# Patient Record
Sex: Male | Born: 1975 | Race: Black or African American | Hispanic: No | Marital: Married | Smoking: Never smoker
Health system: Southern US, Community
[De-identification: ages and names within clinical notes are randomized; demographics above are authoritative.]

## PROBLEM LIST (undated history)

## (undated) ENCOUNTER — Emergency Department: Source: Home / Self Care

## (undated) DIAGNOSIS — F909 Attention-deficit hyperactivity disorder, unspecified type: Secondary | ICD-10-CM

## (undated) DIAGNOSIS — L409 Psoriasis, unspecified: Secondary | ICD-10-CM

## (undated) HISTORY — PX: TESTICLE TORSION REDUCTION: SHX795

## (undated) HISTORY — PX: OTHER SURGICAL HISTORY: SHX169

## (undated) HISTORY — DX: Attention-deficit hyperactivity disorder, unspecified type: F90.9

## (undated) HISTORY — DX: Psoriasis, unspecified: L40.9

---

## 1998-08-25 ENCOUNTER — Encounter: Payer: Self-pay | Admitting: *Deleted

## 1998-08-25 ENCOUNTER — Emergency Department (HOSPITAL_COMMUNITY): Admission: EM | Admit: 1998-08-25 | Discharge: 1998-08-25 | Payer: Self-pay | Admitting: *Deleted

## 2007-07-27 ENCOUNTER — Ambulatory Visit: Payer: Self-pay | Admitting: Internal Medicine

## 2007-07-28 LAB — CONVERTED CEMR LAB
Cholesterol: 174 mg/dL (ref 0–200)
Glucose, Bld: 79 mg/dL (ref 70–99)
HDL: 55.7 mg/dL (ref >39.0)
LDL Cholesterol: 110 mg/dL — ABNORMAL HIGH (ref 0–99)
Total CHOL/HDL Ratio: 3.1
Triglycerides: 43 mg/dL (ref 0–149)
VLDL: 9 mg/dL (ref 0–40)

## 2007-11-14 ENCOUNTER — Ambulatory Visit: Payer: Self-pay | Admitting: Internal Medicine

## 2007-11-14 DIAGNOSIS — F988 Other specified behavioral and emotional disorders with onset usually occurring in childhood and adolescence: Secondary | ICD-10-CM

## 2007-11-14 DIAGNOSIS — F900 Attention-deficit hyperactivity disorder, predominantly inattentive type: Secondary | ICD-10-CM | POA: Insufficient documentation

## 2007-12-19 ENCOUNTER — Ambulatory Visit: Payer: Self-pay | Admitting: Internal Medicine

## 2008-01-19 ENCOUNTER — Ambulatory Visit: Payer: Self-pay | Admitting: Internal Medicine

## 2008-02-20 ENCOUNTER — Telehealth (INDEPENDENT_AMBULATORY_CARE_PROVIDER_SITE_OTHER): Payer: Self-pay | Admitting: *Deleted

## 2020-05-27 ENCOUNTER — Encounter: Payer: Self-pay | Admitting: Internal Medicine

## 2020-05-27 ENCOUNTER — Other Ambulatory Visit: Payer: Self-pay

## 2020-05-27 ENCOUNTER — Ambulatory Visit (INDEPENDENT_AMBULATORY_CARE_PROVIDER_SITE_OTHER): Payer: Self-pay | Admitting: Internal Medicine

## 2020-05-27 ENCOUNTER — Ambulatory Visit (INDEPENDENT_AMBULATORY_CARE_PROVIDER_SITE_OTHER)
Admission: RE | Admit: 2020-05-27 | Discharge: 2020-05-27 | Disposition: A | Discharge status: Home / Self Care | Payer: Self-pay | Source: Ambulatory Visit | Attending: Internal Medicine | Admitting: Internal Medicine

## 2020-05-27 VITALS — BP 106/80 | HR 81 | Temp 97.9°F | Ht 73.0 in | Wt 221.0 lb

## 2020-05-27 DIAGNOSIS — M79671 Pain in right foot: Secondary | ICD-10-CM | POA: Insufficient documentation

## 2020-05-27 DIAGNOSIS — L409 Psoriasis, unspecified: Secondary | ICD-10-CM | POA: Insufficient documentation

## 2020-05-27 NOTE — Assessment & Plan Note (Addendum)
With story, considered DVT or venous disease-----not applicable considering the area involved I am concerned about bony changes or possible stress fracture---will check x-ray X-ray negative except for some swelling  Discussed ice when it hurts Helped by NSAIDs---continue Will set up with Dr Patsy Lager

## 2020-05-27 NOTE — Progress Notes (Signed)
Subjective:    Patient ID: Jose Lee, male    DOB: 1976-10-12, 44 y.o.   MRN: [id number]  HPI Here due to right foot swelling Hasn't been seen in over 10 years---no other doctors  This visit occurred during the SARS-CoV-2 public health emergency.  Safety protocols were in place, including screening questions prior to the visit, additional usage of staff PPE, and extensive cleaning of exam room while observing appropriate contact time as indicated for disinfecting solutions.   No longer has issues with attention  Psoriasis for 5 years or so Doing well with cosentyx ---though doesn't like having to take the shots though  No trauma but noticed some pain in foot ~2 months ago Pain was more pronounced and caused limp Then swelling started--bad enough that toes weren't touching ground Swelling seems worse at night--- and can actually wake him up at night Tried compression socks--no clear improvement Swelling is intermittent  Current Outpatient Medications on File Prior to Visit  Medication Sig Dispense Refill  . Clobetasol Propionate Emulsion 0.05 % topical foam Apply topically 2 (two) times daily as needed.    . Secukinumab (COSENTYX [place]) Inject into the skin.     No current facility-administered medications on file prior to visit.    No Known Allergies  Past Medical History:  Diagnosis Date  . ADHD   . Psoriasis     Past Surgical History:  Procedure Laterality Date  . TESTICLE TORSION REDUCTION  age 40    Family History  Problem Relation Age of Onset  . Diabetes Mother   . Hypertension Father   . Heart disease Father   . Obesity Brother   . Cancer Neg Hx     Social History   Socioeconomic History  . Marital status: Married    Spouse name: Not on file  . Number of children: 2  . Years of education: Not on file  . Highest education level: Not on file  Occupational History  . Occupation: Radio producer    Comment: [employer]  Tobacco Use  . Smoking status:  Never Smoker  . Smokeless tobacco: Never Used  Substance and Sexual Activity  . Alcohol use: Yes    Comment: occasional  . Drug use: Not on file  . Sexual activity: Not on file  Other Topics Concern  . Not on file  Social History Narrative  . Not on file   Social Determinants of Health   Financial Resource Strain:   . Difficulty of Paying Living Expenses:   Food Insecurity:   . Worried About Programme researcher, broadcasting/film/video in the Last Year:   . Barista in the Last Year:   Transportation Needs:   . Freight forwarder (Medical):   Marland Kitchen Lack of Transportation (Non-Medical):   Physical Activity:   . Days of Exercise per Week:   . Minutes of Exercise per Session:   Stress:   . Feeling of Stress :   Social Connections:   . Frequency of Communication with Friends and Family:   . Frequency of Social Gatherings with Friends and Family:   . Attends Religious Services:   . Active Member of Clubs or Organizations:   . Attends Banker Meetings:   Marland Kitchen Marital Status:   Intimate Partner Violence:   . Fear of Current or Ex-Partner:   . Emotionally Abused:   Marland Kitchen Physically Abused:   . Sexually Abused:    Review of Systems No chest pain Appetite is good  No history of gout Did have left toe pain a couple of years ago-derm felt it was the psoriasis    Objective:   Physical Exam Constitutional:      Appearance: Normal appearance.  Musculoskeletal:     Right lower leg: No edema.     Left lower leg: No edema.     Comments: Mild swelling in right forefoot Tenderness ~ 2nd and 3rd metatarsals distally No inflammation No calf tenderness or swelling  Neurological:     Mental Status: He is alert.            Assessment & Plan:

## 2020-06-17 ENCOUNTER — Encounter: Payer: Self-pay | Admitting: Family Medicine

## 2020-06-17 ENCOUNTER — Other Ambulatory Visit: Payer: Self-pay

## 2020-06-17 ENCOUNTER — Ambulatory Visit (INDEPENDENT_AMBULATORY_CARE_PROVIDER_SITE_OTHER): Payer: BC Managed Care – PPO | Admitting: Family Medicine

## 2020-06-17 VITALS — BP 112/80 | HR 68 | Temp 98.4°F | Ht 73.0 in | Wt 223.0 lb

## 2020-06-17 DIAGNOSIS — M79671 Pain in right foot: Secondary | ICD-10-CM

## 2020-06-17 DIAGNOSIS — M8430XA Stress fracture, unspecified site, initial encounter for fracture: Secondary | ICD-10-CM | POA: Diagnosis not present

## 2020-06-17 NOTE — Patient Instructions (Signed)
"  closed toe cam walker"

## 2020-06-17 NOTE — Progress Notes (Signed)
Jose Plant T. Ikechukwu Cerny, MD, CAQ Sports Medicine  Primary Care and Sports Medicine [facility] at [facility] [address] [place], [address]  Phone: [phone]  FAX: [phone]  Jose Lee - 44 y.o. male  MRN [id number]  Date of Birth: [birth year]  Date: 06/17/2020  PCP: Karie Schwalbe, MD  Referral: Karie Schwalbe, MD  Chief Complaint  Patient presents with  . Foot Pain    Right    This visit occurred during the SARS-CoV-2 public health emergency.  Safety protocols were in place, including screening questions prior to the visit, additional usage of staff PPE, and extensive cleaning of exam room while observing appropriate contact time as indicated for disinfecting solutions.   Subjective:   Jose Lee is a 44 y.o. very pleasant male patient with Body mass index is 29.16 kg/m. who presents with the following:  New consultation on a patient of Dr. Karle Starch with R sided foot pain.  I reviewed his prior films which did not show a fracture.  This is an independent review by myself.  We reviewed these face-to-face in the office.  He has been having some pain for approximately 4 to 5 months relatively distal but not in the MTP joints.  He is not any specific trauma or injury.  He has increased his walking to approximately 15,000 steps a day.  He has not had any increased risk in terms of started a jogging or other new different exercise program.  He is globally healthy and is never had an insufficiency fracture or fragility fracture.  He does have some dorsal swelling, but he is never had any bruising.   Review of Systems is noted in the HPI, as appropriate   Objective:   Ht 6\' 1"  (1.854 m)   BMI 29.16 kg/m    GEN: No acute distress; alert,appropriate. PULM: Breathing comfortably in no respiratory distress PSYCH: Normally interactive.    Right foot and ankle are nontender from the tibia to fibula.  Kleiger test is negative.   Full range of motion at the knee.  Full range of motion at the ankle with intact strength.  Nontender at the medial lateral malleolus.  Nontender at the talus.  Nontender at the cuneiforms, cuboid, navicular.  The entirety of the midfoot is nontender.  The fifth metatarsal as well as the first metatarsal are nontender.  Nontender at the first MTP as well as minimally to nontender throughout the remainder of the MTP joints.  On the distal shaft proximal to the MTP joint the patient does have tenderness to palpation in shafts 2 through 4.  Radiology: DG Foot Complete Right  Result Date: 05/27/2020 CLINICAL DATA:  Right forefoot pain and swelling. EXAM: RIGHT FOOT COMPLETE - 3+ VIEW COMPARISON:  None. FINDINGS: There is no evidence of fracture or dislocation. There is no evidence of arthropathy or other focal bone abnormality. Minor soft tissue edema over the dorsum of the foot. IMPRESSION: Minor soft tissue edema over the dorsum of the foot. No osseous abnormality. Electronically Signed   By: 05/29/2020 M.D.   On: 05/27/2020 15:57    Assessment and Plan:     ICD-10-CM   1. Right foot pain  M79.671   2. Stress reaction of bone  M84.30XA    Total encounter time: 30 minutes. This includes total time spent on the day of encounter.  Chart review, x-ray review, and anatomy reviewed with the patient using an anatomical model.  Patient  does wear steel toe shoes, and this presents somewhat limiting factor.  Ideally I would have placed him in a postoperative shoe, but for work he has to have a closed toed shoe.  The cam walker as we have in the office would not work here.  We did review some options over the Internet, and I showed him a close toed fracture boot.  He is going to buy this himself and I will reassess him in 1 month.  Follow-up: Return in about 1 month (around 07/18/2020).  No orders of the defined types were placed in this encounter.  There are no discontinued medications. No orders  of the defined types were placed in this encounter.   Signed,  Elpidio Galea. Kolbie Lepkowski, MD   Outpatient Encounter Medications as of 06/17/2020  Medication Sig  . Clobetasol Propionate Emulsion 0.05 % topical foam Apply topically 2 (two) times daily as needed.  . Secukinumab (COSENTYX [place]) Inject into the skin.   No facility-administered encounter medications on file as of 06/17/2020.

## 2020-07-18 ENCOUNTER — Ambulatory Visit: Payer: BC Managed Care – PPO | Admitting: Internal Medicine

## 2020-07-22 ENCOUNTER — Ambulatory Visit (INDEPENDENT_AMBULATORY_CARE_PROVIDER_SITE_OTHER): Payer: BC Managed Care – PPO | Admitting: Family Medicine

## 2020-07-22 ENCOUNTER — Encounter: Payer: Self-pay | Admitting: Family Medicine

## 2020-07-22 ENCOUNTER — Other Ambulatory Visit: Payer: Self-pay

## 2020-07-22 VITALS — BP 100/60 | HR 69 | Temp 98.7°F | Ht 73.0 in | Wt 221.5 lb

## 2020-07-22 DIAGNOSIS — M79671 Pain in right foot: Secondary | ICD-10-CM | POA: Diagnosis not present

## 2020-07-22 DIAGNOSIS — M8430XA Stress fracture, unspecified site, initial encounter for fracture: Secondary | ICD-10-CM | POA: Diagnosis not present

## 2020-07-22 NOTE — Patient Instructions (Addendum)
Take Tylenol/Acetaminophen ES (500mg ) 2 tabs by mouth three times a day max as needed.  Alleve 2 tabs by mouth two times a day over the counter: Take at least for 2 - 3 weeks. This is equal to a prescripton strength dose (GENERIC CHEAPER EQUIVALENT IS NAPROXEN SODIUM)    REFERRALS TO SPECIALISTS, SPECIAL TESTS (MRI, CT, ULTRASOUNDS)  [place] will help you.   During the  Covid-19 outbreak we are not having people meet with the patient care coordinators for their protection.  They will call you when your appointment is made.   In the most extreme case (like a stroke), I will try to get them to talk to you in the office, but some insurances require paperwork and authorizations.  This will always take some time.

## 2020-07-22 NOTE — Progress Notes (Signed)
Britt Petroni T. Carylon Tamburro, MD, CAQ Sports Medicine  Primary Care and Sports Medicine [facility] at [facility] [address] [place], [address]  Phone: [phone]   FAX: [phone]  Jose Lee - 44 y.o. male   MRN [id number]   Date of Birth: May 14, 1976  Date: 07/22/2020   PCP: Karie Schwalbe, MD   Referral: Karie Schwalbe, MD  Chief Complaint  Patient presents with   Follow-up    Right Foot    This visit occurred during the SARS-CoV-2 public health emergency.  Safety protocols were in place, including screening questions prior to the visit, additional usage of staff PPE, and extensive cleaning of exam room while observing appropriate contact time as indicated for disinfecting solutions.   Subjective:   Jose Lee is a 44 y.o. very pleasant male patient with Body mass index is 29.22 kg/m. who presents with the following:  I placed him in a closed-toe fracture boot due to clinically a R sided 2-4 stress reaction.  He has not been able to wear his cam walker boot in the workday, and he is really not been wearing it.  He continues to walk somewhere between 12 and 15,000 steps a day.  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  15,000 steps day increased  06/17/2020 Last OV with Hannah Beat, MD  New consultation on a patient of Dr. Karle Starch with R sided foot pain.  I reviewed his prior films which did not show a fracture.  This is an independent review by myself.  We reviewed these face-to-face in the office.  He has been having some pain for approximately 4 to 5 months relatively distal but not in the MTP joints.  He is not any specific trauma or injury.  He has increased his walking to approximately 15,000 steps a day.  He has not had any increased risk in terms of started a jogging or other new different exercise program.  He is globally healthy and is  never had an insufficiency fracture or fragility fracture.  He does have some dorsal swelling, but he is never had any bruising.  Review of Systems is noted in the HPI, as appropriate   Objective:   BP 100/60    Pulse 69    Temp 98.7 F (37.1 C) (Temporal)    Ht 6\' 1"  (1.854 m)    Wt 221 lb 8 oz (100.5 kg)    SpO2 97%    BMI 29.22 kg/m    GEN: No acute distress; alert,appropriate. PULM: Breathing comfortably in no respiratory distress PSYCH: Normally interactive.    Right side: The entirety of the tibia, fibula, ankle, hindfoot as well as the midfoot are nontender.  He does have tenderness predominantly at the second and third metatarsal shafts.  In the soft tissues there is less pain.  Nontender at shafts 4 and 5 as well as 1.  Radiology: DG Foot Complete Right  Result Date: 05/27/2020 CLINICAL DATA:  Right forefoot pain and swelling. EXAM: RIGHT FOOT COMPLETE - 3+ VIEW COMPARISON:  None. FINDINGS: There is no evidence of fracture or dislocation. There is no evidence of arthropathy or other focal bone abnormality. Minor soft tissue edema over the dorsum of the foot. IMPRESSION: Minor soft tissue edema over the dorsum of the foot. No osseous abnormality. Electronically Signed   By: 05/29/2020 M.D.   On: 05/27/2020  15:57   Assessment and Plan:     ICD-10-CM   1. Right foot pain  M79.671 MR FOOT RIGHT WO CONTRAST  2. Stress reaction of bone  M84.30XA MR FOOT RIGHT WO CONTRAST   Clinical concern for metatarsal stress reaction versus stress fracture.  Definitive diagnosis is needed for appropriate treatment.  Assessment for occult fracture needs to occur as well.  Obtain an MRI of the right foot to evaluate for occult fracture, stress fracture of the metatarsals.  Further treatment plan depends entirely on the patient's MRI of his foot.  There are no discontinued medications. Orders Placed This Encounter  Procedures   MR FOOT RIGHT WO CONTRAST    Signed,  Nettie Cromwell T.  Graysen Depaula, MD   Outpatient Encounter Medications as of 07/22/2020  Medication Sig   Clobetasol Propionate Emulsion 0.05 % topical foam Apply topically 2 (two) times daily as needed.   Secukinumab (COSENTYX [place]) Inject into the skin.   No facility-administered encounter medications on file as of 07/22/2020.

## 2020-08-07 ENCOUNTER — Ambulatory Visit
Admission: RE | Admit: 2020-08-07 | Discharge: 2020-08-07 | Disposition: A | Discharge status: Home / Self Care | Payer: BC Managed Care – PPO | Source: Ambulatory Visit | Attending: Family Medicine | Admitting: Family Medicine

## 2020-08-07 ENCOUNTER — Other Ambulatory Visit: Payer: Self-pay

## 2020-08-07 DIAGNOSIS — M79671 Pain in right foot: Secondary | ICD-10-CM

## 2020-08-07 DIAGNOSIS — M8430XA Stress fracture, unspecified site, initial encounter for fracture: Secondary | ICD-10-CM

## 2020-08-12 ENCOUNTER — Other Ambulatory Visit: Payer: BC Managed Care – PPO

## 2020-08-22 ENCOUNTER — Telehealth: Payer: Self-pay | Admitting: Internal Medicine

## 2020-08-22 NOTE — Telephone Encounter (Signed)
Patient came into office and dropped off disability paperwork. Patient stated it is due November 1st. Placed in tower.

## 2020-08-23 NOTE — Telephone Encounter (Signed)
disability paperwork in dr copland's in box  On mri results you wanted pt to be out of work 4 weeks.  Pt would like 6 weeks Need end date on question 1 and return to work on question 2

## 2020-08-23 NOTE — Telephone Encounter (Signed)
I don't have this Are you working on it?

## 2020-08-25 NOTE — Telephone Encounter (Signed)
I cannot clear him to return to work without checking him in the office  Please make an appointment

## 2020-08-26 NOTE — Telephone Encounter (Signed)
Pt will be going out of work on 09/02/20   He scheduled follow up with you 09/23/20 to discuss when he can return to work

## 2020-08-27 NOTE — Telephone Encounter (Signed)
Pt is requesting a callback to understand why he needs to be seen sooner than the appointment he made 2 days.

## 2020-08-27 NOTE — Telephone Encounter (Signed)
Can you call him directly:  I have told him to immobilize his foot starting 06/17/2020 a couple of times, and then again 08/18/2020 when his MRI returned.  He needed to do this well before now and well before 09/02/2020, and I need to document everything very clearly for his FMLA.  It is not appropriate for him not to come into the office until 09/23/2020, and he needs to be seen now.

## 2020-08-27 NOTE — Telephone Encounter (Signed)
Dr. Patsy Lager please call patient. .  We could not get Mr. Jose Lee to understand that he needs an appointment to follow up, no matter how we tried to explain it to him. He wants a call back to understand why he needs an appointment.  I don't know how clearer to make it.

## 2020-08-27 NOTE — Telephone Encounter (Signed)
Tried to call Jose Lee. Mailbox is full and can not accept any messages at this time.

## 2020-08-28 NOTE — Telephone Encounter (Signed)
Jose Lee,   Can you set him up with a 4 week follow-up after 09/02/2020, approximately 09/30/2020 or slightly after Thanksgiving if needed.

## 2020-08-28 NOTE — Telephone Encounter (Signed)
I had an approximate 30-minute conversation with the patient right now.  I recommended that he come into the office to discuss multiple issues and reexamine his foot.  I have not seen the patient in over 5 weeks.  I was going to review his MRI with him face-to-face and review all films and show him the area of concern.  I reviewed with him that this is fairly standard.  He has been having symptoms for 6 months, and unfortunately he has not been able to immobilize his foot secondary to working concerns and requirement of walking approximately 15,000 steps a day.  He also requires steel toed boots.  We talked in the office previously, and when his MRI returned, I recommended immobilization and if not possible he would need to be out of work to ensure healing.  I read aloud my exact wording.  Without immobilization and without decreasing his steps, then I am doubtful that his stress reaction or fracture would improve and it very likely would worsen.  There is a clinical concern for fracture worsening, and in a worse case scenario developing a true fracture with displacement.  I also wanted to review with him face-to-face his disability paperwork and answer any and all of his questions.  He had some question regarding my thought process on face-to-face encounter and clinical reassessment.  I told the patient initially in August and subsequently in September that my clinical diagnosis was stress reaction or stress fracture of the second and third metatarsals and I recommended immobilization and decreased walking. MRI 10/7 confirmed my 06/2020 and 07/2020 diagnosis.  The patient had some questions regarding clinical judgment in the field of medicine in general compared to diagnosis with advanced imaging or other testing.  I did my best to answer these questions, and in the vast majority of cases with metatarsal stress fractures and stress reactions these injuries do well with clinical assessment and management  alone.  Most heal in 4 weeks.  My plan is to initiate out of work status for 09/02/2020 for his short-term disability with reassessment 4 weeks later.   Electronically Signed  By: Hannah Beat, MD On: 08/28/2020 5:41 PM   Cc: Mrs. Syliva Overman, RN

## 2020-08-29 NOTE — Telephone Encounter (Signed)
11/29 appointment Pt aware

## 2020-09-02 DIAGNOSIS — Z0279 Encounter for issue of other medical certificate: Secondary | ICD-10-CM

## 2020-09-03 NOTE — Telephone Encounter (Signed)
Paperwork faxed Pt aware  

## 2020-09-04 NOTE — Telephone Encounter (Signed)
Copy for pt °Copy for scan °Copy for billing °

## 2020-09-23 ENCOUNTER — Ambulatory Visit: Payer: BC Managed Care – PPO | Admitting: Family Medicine

## 2020-09-30 ENCOUNTER — Other Ambulatory Visit: Payer: Self-pay

## 2020-09-30 ENCOUNTER — Ambulatory Visit (INDEPENDENT_AMBULATORY_CARE_PROVIDER_SITE_OTHER): Payer: BC Managed Care – PPO | Admitting: Family Medicine

## 2020-09-30 ENCOUNTER — Ambulatory Visit (INDEPENDENT_AMBULATORY_CARE_PROVIDER_SITE_OTHER)
Admission: RE | Admit: 2020-09-30 | Discharge: 2020-09-30 | Disposition: A | Discharge status: Home / Self Care | Payer: BC Managed Care – PPO | Source: Ambulatory Visit | Attending: Family Medicine | Admitting: Family Medicine

## 2020-09-30 VITALS — BP 110/78 | HR 84 | Temp 98.0°F | Ht 73.0 in | Wt 225.0 lb

## 2020-09-30 DIAGNOSIS — M2550 Pain in unspecified joint: Secondary | ICD-10-CM

## 2020-09-30 DIAGNOSIS — M79671 Pain in right foot: Secondary | ICD-10-CM | POA: Diagnosis not present

## 2020-09-30 DIAGNOSIS — M8430XA Stress fracture, unspecified site, initial encounter for fracture: Secondary | ICD-10-CM

## 2020-09-30 DIAGNOSIS — L409 Psoriasis, unspecified: Secondary | ICD-10-CM | POA: Diagnosis not present

## 2020-09-30 MED ORDER — PREDNISONE 20 MG PO TABS: ORAL_TABLET | ORAL | 0 refills | Status: DC

## 2020-09-30 NOTE — Progress Notes (Signed)
Jose Lee T. Jose Hawkey, MD, CAQ Sports Medicine  Primary Care and Sports Medicine [facility] [address] [place], [address]  Phone: [phone]  FAX: [phone]  Jose Lee - 44 y.o. male  MRN [id number]  Date of Birth: 1975-12-19  Date: 09/30/2020  PCP: Karie Schwalbe, MD  Referral: Karie Schwalbe, MD  Chief Complaint  Patient presents with  . Follow-up    R foot    This visit occurred during the SARS-CoV-2 public health emergency.  Safety protocols were in place, including screening questions prior to the visit, additional usage of staff PPE, and extensive cleaning of exam room while observing appropriate contact time as indicated for disinfecting solutions.   Subjective:   Jose Lee is a 44 y.o. very pleasant male patient with Body mass index is 29.69 kg/m. who presents with the following:  F/u R stress reaction of MT shaft at 2nd and 3rd MT shafts, MRI confirmed.  He continues to do poorly.  He has been having symptoms for approximately 6 months.  He does walk somewhere around 10-15,000 steps a day, and he was never able to limit this at work.  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  He limited his walking to approximately 1000-1500 steps daily.  His swelling has decreased somewhat, but he still does have some quite significant pain in the distal forefoot.  He also has some pain in the MTP joints to a much lesser extent.  He is very frustrated at his lack of progress, and his inability to work.  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  Patient Active Problem List   Diagnosis Date Noted  . Right foot pain 05/27/2020  . Psoriasis   . ATTENTION DEFICIT DISORDER 11/14/2007    Past Medical History:  Diagnosis Date  . ADHD   . Psoriasis     Past Surgical History:  Procedure Laterality Date  .  TESTICLE TORSION REDUCTION  age 57    Family History  Problem Relation Age of Onset  . Diabetes Mother   . Hypertension Father   . Heart disease Father   . Obesity Brother   . Cancer Neg Hx      Review of Systems is noted in the HPI, as appropriate   Objective:   BP 110/78   Pulse 84   Temp 98 F (36.7 C) (Temporal)   Ht 6\' 1"  (1.854 m)   Wt 225 lb (102.1 kg)   SpO2 97%   BMI 29.69 kg/m   Right foot: He is nontender throughout the entirety of the tibia and fibula.  Nontender at the calcaneus.  Nontender at the talus.  Nontender at the navicular, cuboid, cuneiforms.  Nontender with drawer testing and subtalar tilt testing.  He is not tender throughout the phalanges.  He does have tenderness with movement and deep palpation at the MTP joints 1 through 3.  2 and 3 are significantly worse compared to 1.  Along the metatarsal shafts there appears to be pain to palpation particularly at 2 and 3 distally and to a lesser extent on 4.  With squeezing the metatarsal heads, he also has some significant pain.  Radiology: MR FOOT RIGHT WO CONTRAST  Result Date: 08/08/2020 CLINICAL DATA:  Forefoot pain primarily between the second and third metatarsal for 6 months. Stress fracture  suspected. EXAM: MRI OF THE RIGHT FOREFOOT WITHOUT CONTRAST TECHNIQUE: Multiplanar, multisequence MR imaging of the right forefoot was performed. No intravenous contrast was administered. COMPARISON:  X-ray 05/27/2020 FINDINGS: Bones/Joint/Cartilage Bone marrow edema within the second and third metatarsal head and neck. Periosteal edema surrounds the second and third metatarsal diaphyses. There is no well-defined linear low signal fracture line. No bony erosion. Mild degenerative changes of the medial hallux-sesamoid complex with subchondral marrow signal changes. The remaining osseous structures are intact. No dislocation. There are small joint effusions involving the second and third MTP joints. Ligaments Intact  Lisfranc ligament. Collateral ligaments of the forefoot are intact. There is pericapsular edema associated with the second and third MTP joints. No evidence of plantar plate disruption. Muscles and Tendons Intramuscular edema within the intrinsic foot musculature adjacent to the second and third metatarsals. No muscle atrophy or fatty infiltration. No intramuscular fluid collection. Intact flexor and extensor tendons. No tenosynovial fluid. Soft tissues Mild dorsal subcutaneous edema.  No soft tissue fluid collection. IMPRESSION: 1. Findings most suggestive of stress-related changes involving the second and third metatarsals. No well-defined fracture line. 2. Small joint effusions involving the second and third MTP joints, likely reactive. 3. Mild degenerative changes of the medial hallux-sesamoid complex. Electronically Signed   By: Duanne Guess D.O.   On: 08/08/2020 08:58   Assessment and Plan:     ICD-10-CM   1. Right foot pain  M79.671 DG Foot Complete Right    Ambulatory referral to Orthopedic Surgery    Sedimentation rate    High sensitivity CRP    ANA Screen,IFA,Reflex Titer/Pattern,Reflex Mplx 11 Ab Cascade with IdentRA    Cyclic citrul peptide antibody, IgG    Rheumatoid factor  2. Stress reaction of bone  M84.30XA DG Foot Complete Right    Ambulatory referral to Orthopedic Surgery    Sedimentation rate    High sensitivity CRP    ANA Screen,IFA,Reflex Titer/Pattern,Reflex Mplx 11 Ab Cascade with IdentRA    Cyclic citrul peptide antibody, IgG    Rheumatoid factor  3. Psoriasis  L40.9 Sedimentation rate    High sensitivity CRP    ANA Screen,IFA,Reflex Titer/Pattern,Reflex Mplx 11 Ab Cascade with IdentRA    Cyclic citrul peptide antibody, IgG    Rheumatoid factor  4. Polyarthralgia  M25.50 Sedimentation rate    High sensitivity CRP    ANA Screen,IFA,Reflex Titer/Pattern,Reflex Mplx 11 Ab Cascade with IdentRA    Cyclic citrul peptide antibody, IgG    Rheumatoid factor   I am at  a loss for why he continues to do so poorly.  He does have psoriasis, so I am going to check him with basic rheumatological panel.  Psoriatic arthritis or rheumatoid arthritis could potentially be a explanation and complicating factor.  Typically metatarsal stress fractures heal easily in a healthy 44 year old.  He has been compliant, and he is minimally been ambulating and he has been in either a stiff shoe or cam walker.  I am going to do a short course of some steroids to see if this provides almost immediate relief in the MTP joints.  If this is the case then rheumatological involvement would be certainly possible.  We did talk about the potential risks and benefits.  With stress reaction only, I think that the potential benefit outweighs potential risk.  We did go over potential risk and that with true fracture, steroids can ultimately delay healing.  In this case, I would like to consult foot and ankle surgery for their opinion.  Assistance is appreciated.  Meds ordered this encounter  Medications  . predniSONE (DELTASONE) 20 MG tablet    Sig: 2 tabs po for 3 days, then 1 tab po for 4 days    Dispense:  10 tablet    Refill:  0   Medications Discontinued During This Encounter  Medication Reason  . Secukinumab (COSENTYX [place]) Change in therapy   Orders Placed This Encounter  Procedures  . DG Foot Complete Right  . Sedimentation rate  . High sensitivity CRP  . ANA Screen,IFA,Reflex Titer/Pattern,Reflex Mplx 11 Ab Cascade with IdentRA  . Cyclic citrul peptide antibody, IgG  . Rheumatoid factor  . Ambulatory referral to Orthopedic Surgery    Follow-up: No follow-ups on file.  Signed,  Elpidio Galea. Tylyn Stankovich, MD   Outpatient Encounter Medications as of 09/30/2020  Medication Sig  . Clobetasol Propionate Emulsion 0.05 % topical foam Apply topically 2 (two) times daily as needed.  . SKYRIZI PEN 150 MG/ML SOAJ every 3 (three) months.   . predniSONE (DELTASONE) 20 MG tablet 2 tabs po  for 3 days, then 1 tab po for 4 days  . [DISCONTINUED] Secukinumab (COSENTYX [place]) Inject into the skin.   No facility-administered encounter medications on file as of 09/30/2020.

## 2020-10-01 ENCOUNTER — Encounter: Payer: Self-pay | Admitting: Family Medicine

## 2020-10-01 LAB — SEDIMENTATION RATE: Sed Rate: 26 mm/hr — ABNORMAL HIGH (ref 0–15)

## 2020-10-01 LAB — HIGH SENSITIVITY CRP: CRP, High Sensitivity: 3.57 mg/L (ref 0.000–5.000)

## 2020-10-07 LAB — ANA SCREEN,IFA,REFLEX TITER/PATTERN,REFLEX MPLX 11 AB CASCADE
14-3-3 eta Protein: <0.2 ng/mL (ref <0.2)
Anti Nuclear Antibody (ANA): NEGATIVE
Cyclic Citrullin Peptide Ab: <16 UNITS
Rheumatoid fact SerPl-aCnc: <14 IU/mL (ref <14)

## 2020-10-10 NOTE — Telephone Encounter (Signed)
Delonta notified as instructed by telephone.  He will have HR fax FMLA paperwork ATTN: Dr. Patsy Lager to fax number [phone] which will come right to my desk.

## 2020-10-10 NOTE — Telephone Encounter (Signed)
Lupita Leash,   I never received it.  I remember all FMLA paperwork I have done in recent months.  If it was sent, it never came to my desk.  I did do his initial paperwork.  His HR department should send an FMLA addendum request to my attention.

## 2020-10-10 NOTE — Telephone Encounter (Signed)
Patient called in stating his employer has not received the paperwork for his extended leave till 12/20. Do not see notations where that was to be done. Pt stated this was already supposed to be done and does not understand how ball was completely dropped. Apologized and stated will check with Dr.Copland if ok to change. Please advise.

## 2020-10-15 ENCOUNTER — Telehealth: Payer: Self-pay | Admitting: Internal Medicine

## 2020-10-15 NOTE — Telephone Encounter (Signed)
Patient came into office and dropped off new forms to extend [facility]. Stated he is frustrated, as is employer, as they have been faxing since the 2nd and no response. Apologized as it was never received. Filled partly and placed in Dr.Copland's inbox for review and signature.

## 2020-10-15 NOTE — Telephone Encounter (Signed)
error 

## 2020-10-16 NOTE — Telephone Encounter (Signed)
They are all completed.  Can we copy, scan, and fax for him. No charge.  I would also like to keep a copy on my desk for now in case there is a problem.  Can you apologize to him.  I also have been frustrated.  Disability and FMLA paperwork basically always shows up on my desk, and I finish it.  I am not sure what happened here: our office, the fax machines, his office's fax machine?  I apologize, and hopefully delay caused no issues at work.

## 2020-10-16 NOTE — Telephone Encounter (Signed)
Called patient. Stated paperwork is done and has been faxed. Expressed understanding.   Copy for scan Copy for patient Copy for Dr.Copland

## 2020-11-04 ENCOUNTER — Other Ambulatory Visit: Payer: Self-pay | Admitting: Internal Medicine

## 2020-11-04 DIAGNOSIS — L409 Psoriasis, unspecified: Secondary | ICD-10-CM

## 2020-11-19 ENCOUNTER — Other Ambulatory Visit: Payer: Self-pay

## 2020-11-28 ENCOUNTER — Other Ambulatory Visit: Payer: Self-pay

## 2020-11-28 ENCOUNTER — Encounter: Payer: Self-pay | Admitting: Internal Medicine

## 2020-11-28 ENCOUNTER — Ambulatory Visit (INDEPENDENT_AMBULATORY_CARE_PROVIDER_SITE_OTHER): Payer: BC Managed Care – PPO | Admitting: Internal Medicine

## 2020-11-28 VITALS — BP 116/74 | HR 58 | Temp 98.0°F | Ht 72.75 in | Wt 224.0 lb

## 2020-11-28 DIAGNOSIS — Z1211 Encounter for screening for malignant neoplasm of colon: Secondary | ICD-10-CM

## 2020-11-28 DIAGNOSIS — L409 Psoriasis, unspecified: Secondary | ICD-10-CM

## 2020-11-28 DIAGNOSIS — Z23 Encounter for immunization: Secondary | ICD-10-CM

## 2020-11-28 DIAGNOSIS — Z0000 Encounter for general adult medical examination without abnormal findings: Secondary | ICD-10-CM

## 2020-11-28 MED ORDER — SILDENAFIL CITRATE 20 MG PO TABS: 60.0000 mg | ORAL_TABLET | Freq: Every day | ORAL | 11 refills | Status: DC | PRN

## 2020-11-28 NOTE — Addendum Note (Signed)
Addended by: Eual Fines on: 11/28/2020 05:17 PM   Modules accepted: Orders

## 2020-11-28 NOTE — Assessment & Plan Note (Signed)
Thinks the skyrizi is working well

## 2020-11-28 NOTE — Assessment & Plan Note (Addendum)
Healthy Needs to work on fitness Td today Needs COVID booster Not excited about flu vaccine FIT No PSA yet

## 2020-11-28 NOTE — Progress Notes (Signed)
Subjective:    Patient ID: Jose Lee, male    DOB: 12/29/1975, 45 y.o.   MRN: [id number]  HPI Here for physical This visit occurred during the SARS-CoV-2 public health emergency.  Safety protocols were in place, including screening questions prior to the visit, additional usage of staff PPE, and extensive cleaning of exam room while observing appropriate contact time as indicated for disinfecting solutions.   No new concerns Still having pain with the foot Got cortisone shot--help 75% [facility]  Now on skyrizi for psoriasis  Current Outpatient Medications on File Prior to Visit  Medication Sig Dispense Refill  . Clobetasol Propionate Emulsion 0.05 % topical foam Apply topically 2 (two) times daily as needed.    . meloxicam (MOBIC) 15 MG tablet Take 15 mg by mouth daily.    . SKYRIZI PEN 150 MG/ML SOAJ every 3 (three) months.      No current facility-administered medications on file prior to visit.    No Known Allergies  Past Medical History:  Diagnosis Date  . ADHD   . Psoriasis     Past Surgical History:  Procedure Laterality Date  . TESTICLE TORSION REDUCTION  age 88    Family History  Problem Relation Age of Onset  . Diabetes Mother   . Hypertension Father   . Heart disease Father   . Obesity Brother   . Cancer Neg Hx     Social History   Socioeconomic History  . Marital status: Married    Spouse name: Not on file  . Number of children: 2  . Years of education: Not on file  . Highest education level: Not on file  Occupational History  . Occupation: Radio producer    Comment: [employer]  Tobacco Use  . Smoking status: Never Smoker  . Smokeless tobacco: Never Used  Substance and Sexual Activity  . Alcohol use: Yes    Comment: occasional  . Drug use: Not on file  . Sexual activity: Not on file  Other Topics Concern  . Not on file  Social History Narrative  . Not on file   Social Determinants of Health   Financial Resource Strain: Not  on file  Food Insecurity: Not on file  Transportation Needs: Not on file  Physical Activity: Not on file  Stress: Not on file  Social Connections: Not on file  Intimate Partner Violence: Not on file   Review of Systems  Constitutional: Negative for fatigue and unexpected weight change.       Not exercising---discussed Wears seat belt  HENT: Negative for hearing loss.        Some "fluttering" sounds in right ear---intermittent Needs periodontist  Eyes: Negative for visual disturbance.       No diplopia or unilateral vision loss  Respiratory: Negative for cough, chest tightness and shortness of breath.   Cardiovascular: Negative for chest pain, palpitations and leg swelling.  Gastrointestinal: Negative for abdominal pain, blood in stool and constipation.       No heartburn  Endocrine: Negative for polydipsia and polyuria.  Genitourinary: Negative for difficulty urinating and urgency.       Mild ED  Musculoskeletal: Negative for arthralgias, back pain and joint swelling.  Skin:       Psoriasis controlled  Allergic/Immunologic: Positive for environmental allergies. Negative for immunocompromised state.       No meds  Neurological: Negative for dizziness, syncope, light-headedness and headaches.  Hematological: Negative for adenopathy. Does not bruise/bleed easily.  Psychiatric/Behavioral: Negative  for dysphoric mood and sleep disturbance. The patient is not nervous/anxious.        Objective:   Physical Exam Constitutional:      Appearance: Normal appearance.  HENT:     Right Ear: Tympanic membrane, ear canal and external ear normal.     Left Ear: Tympanic membrane, ear canal and external ear normal.     Mouth/Throat:     Pharynx: No oropharyngeal exudate or posterior oropharyngeal erythema.  Eyes:     Conjunctiva/sclera: Conjunctivae normal.     Pupils: Pupils are equal, round, and reactive to light.  Cardiovascular:     Rate and Rhythm: Normal rate and regular rhythm.      Pulses: Normal pulses.     Heart sounds: No murmur heard. No gallop.   Pulmonary:     Effort: Pulmonary effort is normal.     Breath sounds: Normal breath sounds. No wheezing or rales.  Abdominal:     Palpations: Abdomen is soft.     Tenderness: There is no abdominal tenderness.  Musculoskeletal:     Cervical back: Neck supple.     Right lower leg: No edema.     Left lower leg: No edema.  Lymphadenopathy:     Cervical: No cervical adenopathy.  Skin:    General: Skin is warm.     Findings: No rash.  Neurological:     General: No focal deficit present.     Mental Status: He is alert and oriented to person, place, and time.  Psychiatric:        Mood and Affect: Mood normal.        Behavior: Behavior normal.            Assessment & Plan:

## 2020-11-28 NOTE — Addendum Note (Signed)
Addended by: Aquilla Solian on: 11/28/2020 04:48 PM   Modules accepted: Orders

## 2020-11-29 LAB — COMPREHENSIVE METABOLIC PANEL
ALT: 14 U/L (ref 0–53)
AST: 14 U/L (ref 0–37)
Albumin: 4.1 g/dL (ref 3.5–5.2)
Alkaline Phosphatase: 66 U/L (ref 39–117)
BUN: 17 mg/dL (ref 6–23)
CO2: 27 mEq/L (ref 19–32)
Calcium: 9.5 mg/dL (ref 8.4–10.5)
Chloride: 104 mEq/L (ref 96–112)
Creatinine, Ser: 1.16 mg/dL (ref 0.40–1.50)
GFR: 75.88 mL/min (ref >60.00)
Glucose, Bld: 79 mg/dL (ref 70–99)
Potassium: 3.8 mEq/L (ref 3.5–5.1)
Sodium: 138 mEq/L (ref 135–145)
Total Bilirubin: 0.7 mg/dL (ref 0.2–1.2)
Total Protein: 7.7 g/dL (ref 6.0–8.3)

## 2020-11-29 LAB — LIPID PANEL
Cholesterol: 157 mg/dL (ref 0–200)
HDL: 53.6 mg/dL (ref >39.00)
LDL Cholesterol: 96 mg/dL (ref 0–99)
NonHDL: 103.86
Total CHOL/HDL Ratio: 3
Triglycerides: 41 mg/dL (ref 0.0–149.0)
VLDL: 8.2 mg/dL (ref 0.0–40.0)

## 2020-11-29 LAB — CBC
HCT: 43.3 % (ref 39.0–52.0)
Hemoglobin: 14.6 g/dL (ref 13.0–17.0)
MCHC: 33.7 g/dL (ref 30.0–36.0)
MCV: 97.4 fl (ref 78.0–100.0)
Platelets: 292 10*3/uL (ref 150.0–400.0)
RBC: 4.44 Mil/uL (ref 4.22–5.81)
RDW: 13.5 % (ref 11.5–15.5)
WBC: 8.1 10*3/uL (ref 4.0–10.5)

## 2020-12-02 ENCOUNTER — Ambulatory Visit: Payer: BC Managed Care – PPO

## 2021-01-23 ENCOUNTER — Other Ambulatory Visit (INDEPENDENT_AMBULATORY_CARE_PROVIDER_SITE_OTHER): Payer: BC Managed Care – PPO

## 2021-01-23 DIAGNOSIS — Z1211 Encounter for screening for malignant neoplasm of colon: Secondary | ICD-10-CM | POA: Diagnosis not present

## 2021-01-23 LAB — FECAL OCCULT BLOOD, IMMUNOCHEMICAL: Fecal Occult Bld: NEGATIVE

## 2021-08-19 DIAGNOSIS — Z20822 Contact with and (suspected) exposure to covid-19: Secondary | ICD-10-CM | POA: Diagnosis not present

## 2021-10-13 DIAGNOSIS — F411 Generalized anxiety disorder: Secondary | ICD-10-CM | POA: Diagnosis not present

## 2021-10-13 DIAGNOSIS — L409 Psoriasis, unspecified: Secondary | ICD-10-CM | POA: Diagnosis not present

## 2021-10-28 ENCOUNTER — Telehealth: Payer: Self-pay | Admitting: *Deleted

## 2021-10-28 NOTE — Telephone Encounter (Signed)
Pt was scheduled for a Med refill with Dr. Milinda Antis on Friday 4pm. 10/31/21  2 issues with this appt.  1st. Dr. Royden Purl 4pm is VIRTUAL only, Mandy sent a staff message letting everyone know this last week.  2nd a med refill should only be done with provider. It should not be on Dr. Royden Purl schedule. Please r/s this appt when PCP is available.

## 2021-10-29 NOTE — Telephone Encounter (Signed)
Pt would like to refill RX stated pharmacy need prior authorization   Encourage patient to contact the pharmacy for refills or they can request refills through [facility]  LAST APPOINTMENT DATE:  Please schedule appointment if longer than 1 year  NEXT APPOINTMENT DATE:  MEDICATION:SKYRIZI PEN 150 MG/ML SOAJ  Is the patient out of medication?   PHARMACY:[facility] [place]   Let patient know to contact pharmacy at the end of the day to make sure medication is ready.  Please notify patient to allow 48-72 hours to process  CLINICAL FILLS OUT ALL BELOW:   LAST REFILL:  QTY:  REFILL DATE:    OTHER COMMENTS:    Okay for refill?  Please advise

## 2021-10-29 NOTE — Telephone Encounter (Signed)
Spoke to pt. He said he was contacted by someone who canceled the appt with Dr Milinda Antis at [facility] Friday and made him a CPE appt that is not until March. I explained this is a medication that a specialist usually orders. Pt stated he is not happy with his dermatologist and that is why he asked his PCP to do it. I advised Dr Alphonsus Sias is out of the office this week and there may be a delay in his response.

## 2021-10-30 NOTE — Telephone Encounter (Signed)
Spoke to pt. As I had advised him yesterday that Dr Alphonsus Sias does not prescribe this., Dr Alphonsus Sias reiterated he does not prescribe this.

## 2021-10-30 NOTE — Telephone Encounter (Signed)
Pt returning your call

## 2021-10-30 NOTE — Telephone Encounter (Signed)
Left message for pt to call back  °

## 2021-10-31 ENCOUNTER — Telehealth: Payer: BC Managed Care – PPO | Admitting: Family Medicine

## 2021-11-10 DIAGNOSIS — L400 Psoriasis vulgaris: Secondary | ICD-10-CM | POA: Diagnosis not present

## 2021-11-14 DIAGNOSIS — L400 Psoriasis vulgaris: Secondary | ICD-10-CM | POA: Diagnosis not present

## 2021-12-14 IMAGING — MR MR FOOT*R* W/O CM
4 of 5 series · 16 of 40 positions shown · non-contrast
Comparison: X-ray 05/27/2020

CLINICAL DATA: Forefoot pain primarily between the second and third
metatarsal for 6 months. Stress fracture suspected.

EXAM:
MRI OF THE RIGHT FOREFOOT WITHOUT CONTRAST
TECHNIQUE: Multiplanar, multisequence MR imaging of the right forefoot was
performed. No intravenous contrast was administered.

[Series 4: T1 · coronal · 3.0mm · 0.19mm/px · 3 of 47 slices shown (1 of 2)]
[im 6/47]
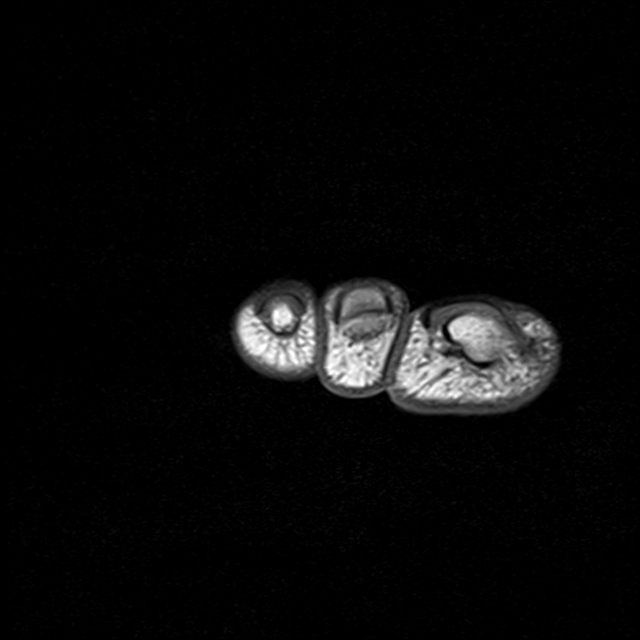
[im 26/47]
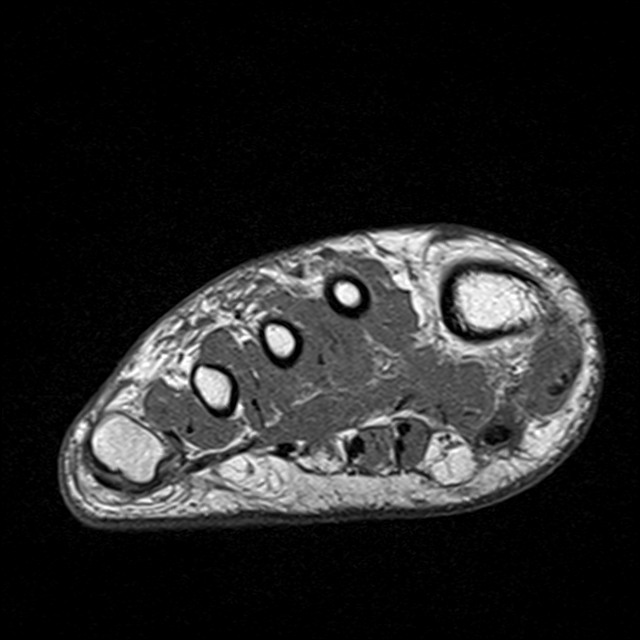
[im 41/47]
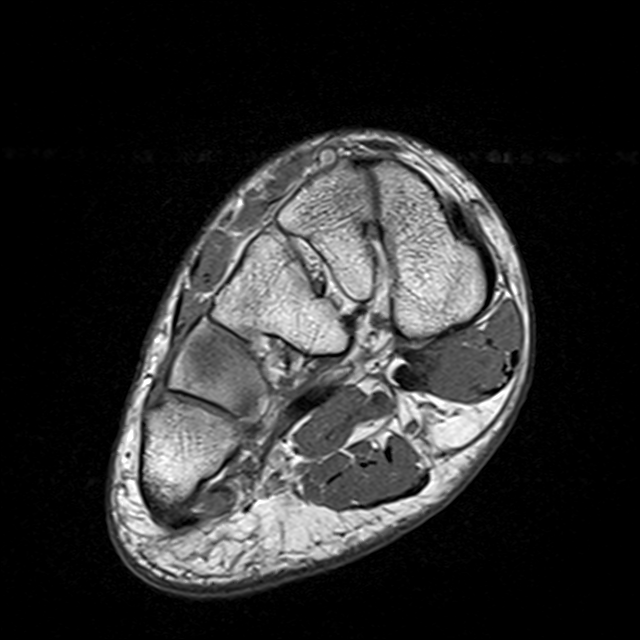

[Series 5: T2 fat-sat · coronal · 3.0mm · 0.19mm/px · 7 of 47 slices shown (1 of 2)]
[im 1/47]
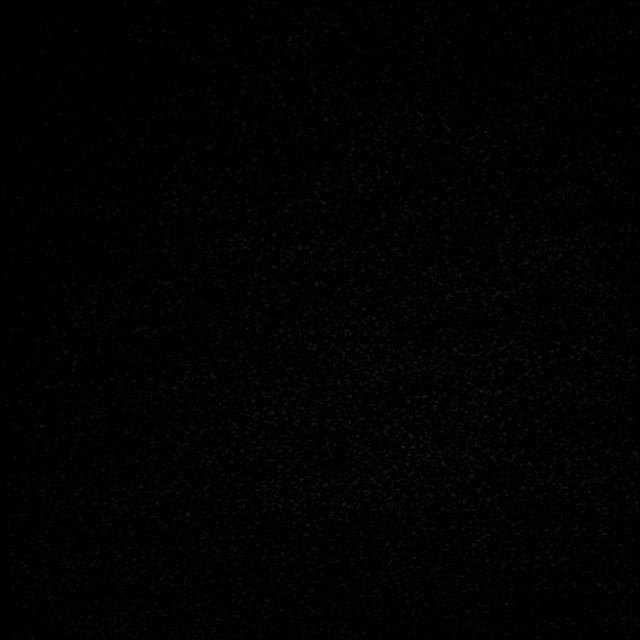
[im 6/47]
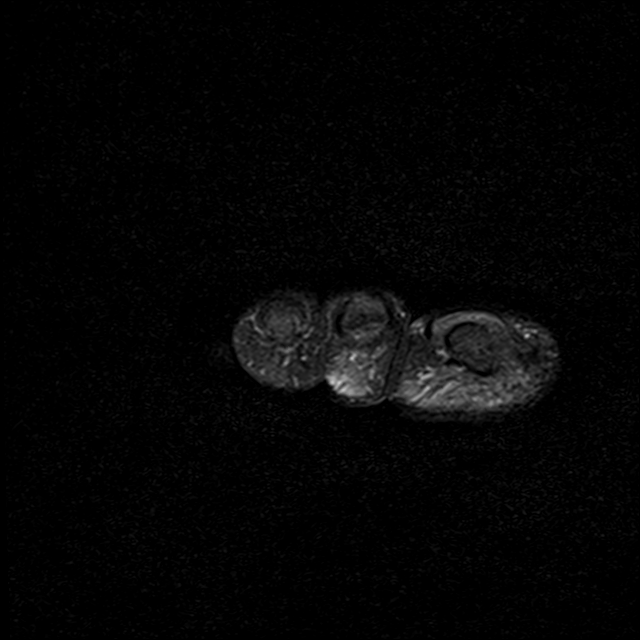
[im 16/47]
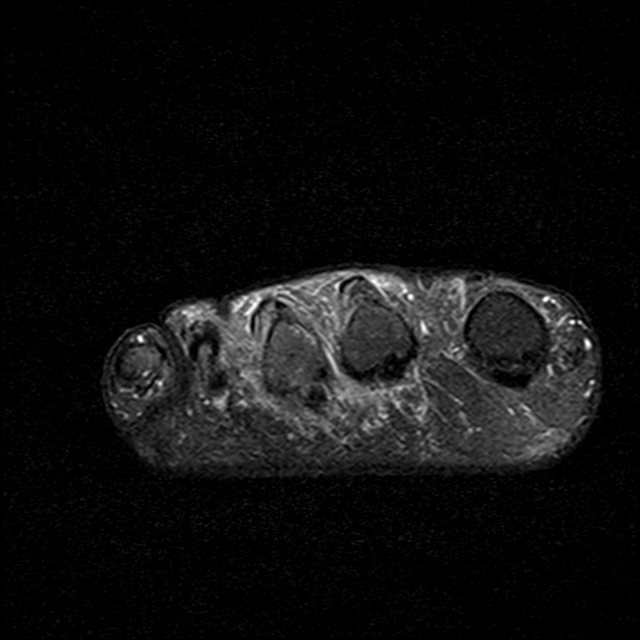
[im 21/47]
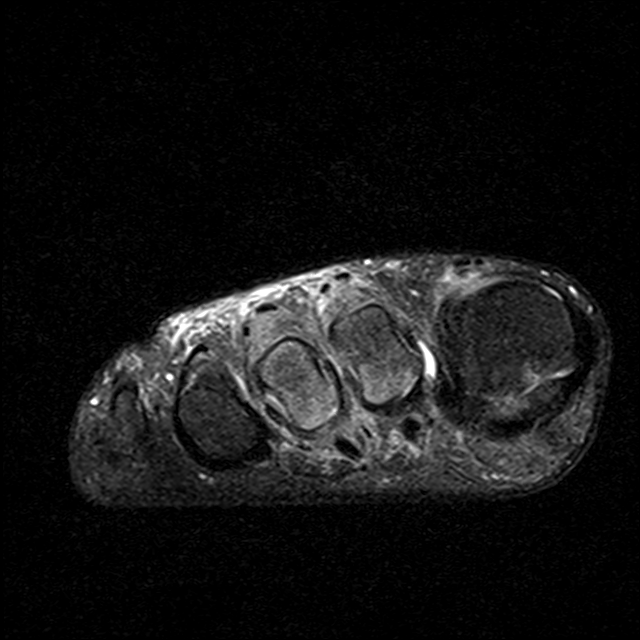
[im 26/47]
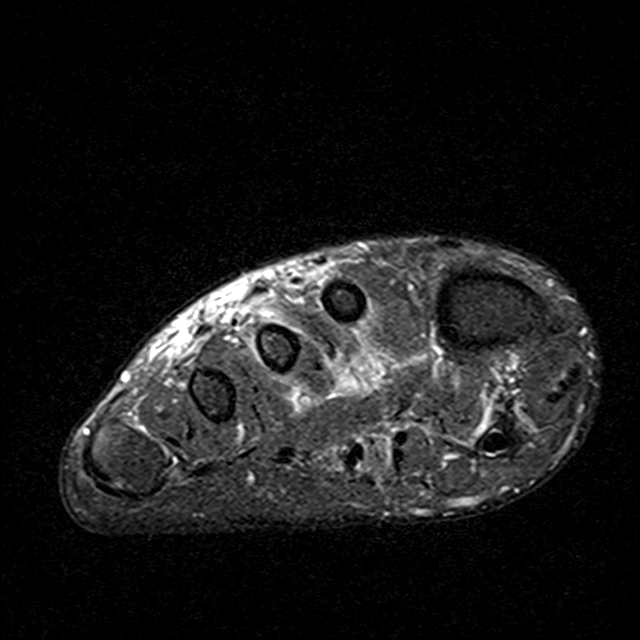
[im 31/47]
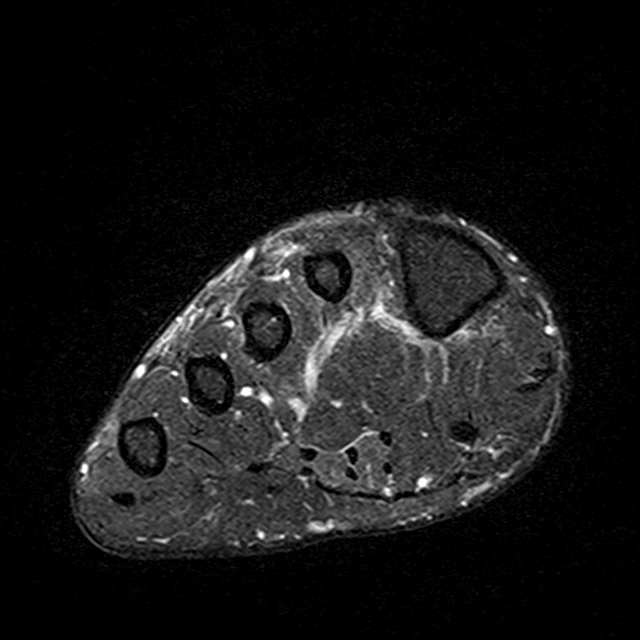
[im 41/47]
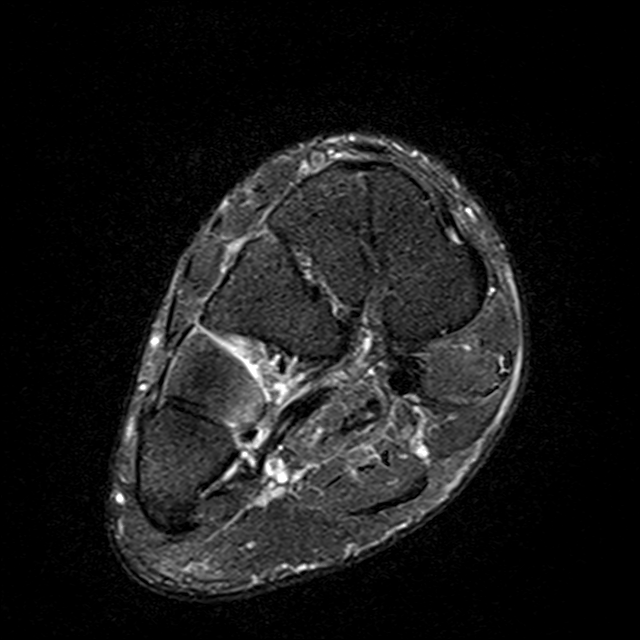

[Series 6: T1 · axial · 3.0mm · 0.35mm/px · z∈[-129,-54]mm · 3 of 26 slices shown (2 of 2)]
[im 6/26]
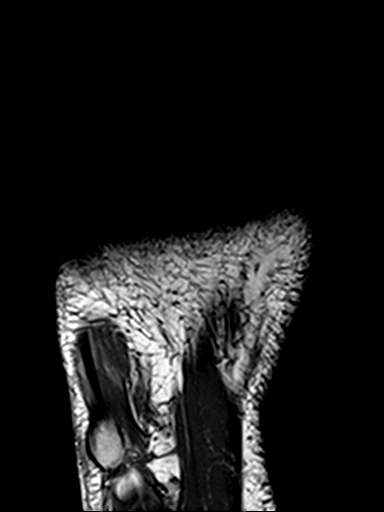
[im 16/26]
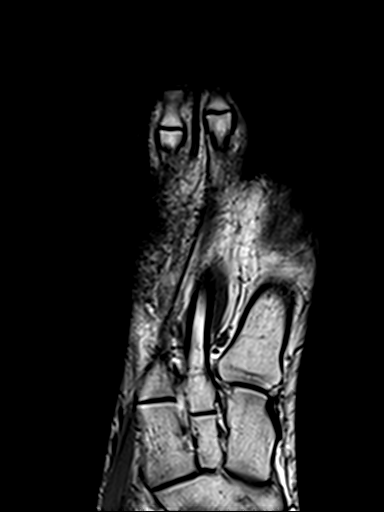
[im 26/26]
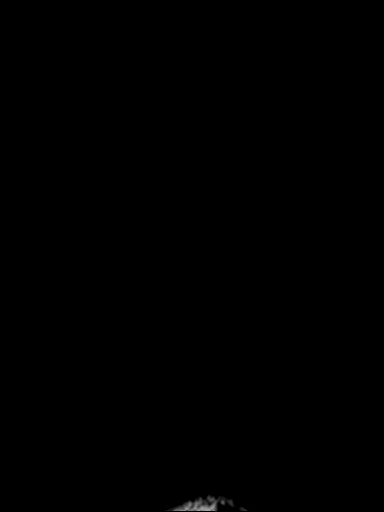

[Series 8: T2 fat-sat · axial · 3.0mm · 0.35mm/px · z∈[-129,-54]mm · 3 of 26 slices shown (2 of 2)]
[im 6/26]
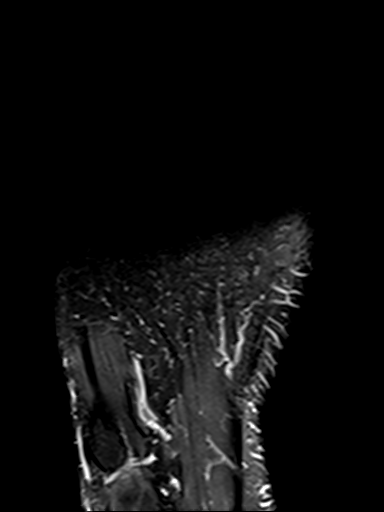
[im 16/26]
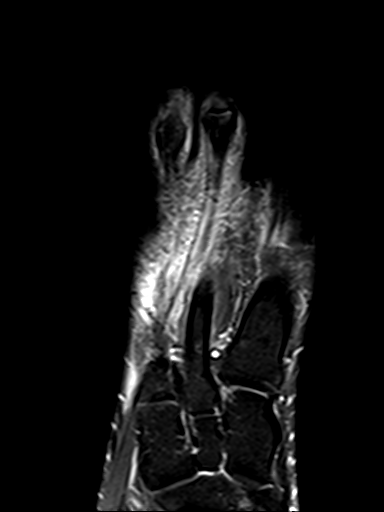
[im 26/26]
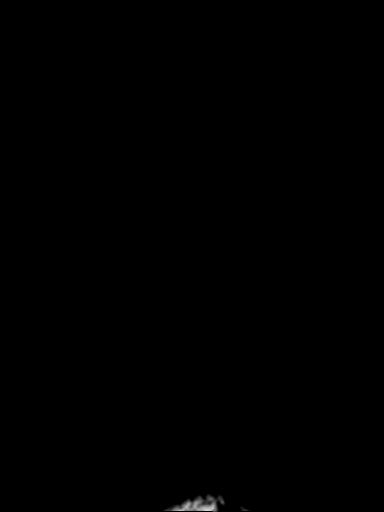

[16 of 40 positions shown; findings below may reference images not displayed]

FINDINGS: Bones/Joint/Cartilage

Bone marrow edema within the second and third metatarsal head and
neck. Periosteal edema surrounds the second and third metatarsal
diaphyses. There is no well-defined linear low signal fracture line.
No bony erosion. Mild degenerative changes of the medial
hallux-sesamoid complex with subchondral marrow signal changes. The
remaining osseous structures are intact. No dislocation. There are
small joint effusions involving the second and third MTP joints.

Ligaments

Intact Lisfranc ligament. Collateral ligaments of the forefoot are
intact. There is pericapsular edema associated with the second and
third MTP joints. No evidence of plantar plate disruption.

Muscles and Tendons

Intramuscular edema within the intrinsic foot musculature adjacent
to the second and third metatarsals. No muscle atrophy or fatty
infiltration. No intramuscular fluid collection. Intact flexor and
extensor tendons. No tenosynovial fluid.

Soft tissues

Mild dorsal subcutaneous edema.  No soft tissue fluid collection.
IMPRESSION: 1. Findings most suggestive of stress-related changes involving the
second and third metatarsals. No well-defined fracture line.
2. Small joint effusions involving the second and third MTP joints,
likely reactive.
3. Mild degenerative changes of the medial hallux-sesamoid complex.

## 2022-01-12 ENCOUNTER — Encounter: Payer: BC Managed Care – PPO | Admitting: Internal Medicine

## 2022-02-06 IMAGING — DX DG FOOT COMPLETE 3+V*R*
3 series · 3 of 3 positions shown · non-contrast
Comparison: 08/07/2020 MRI.  05/27/2020 radiography

CLINICAL DATA: Dorsal foot pain. On going and refractory with
stress reaction

EXAM:
RIGHT FOOT COMPLETE - 3+ VIEW

[foot ap]
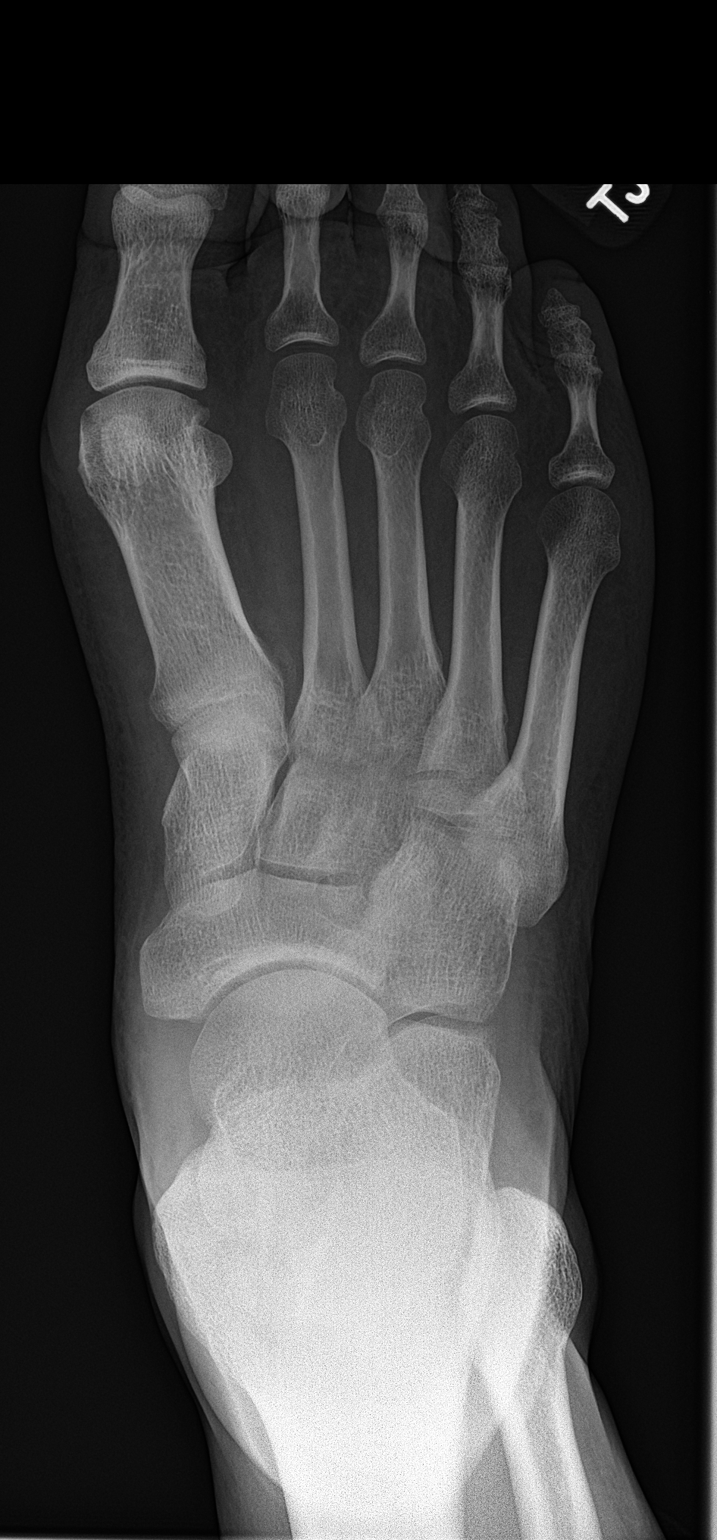

[foot obl]
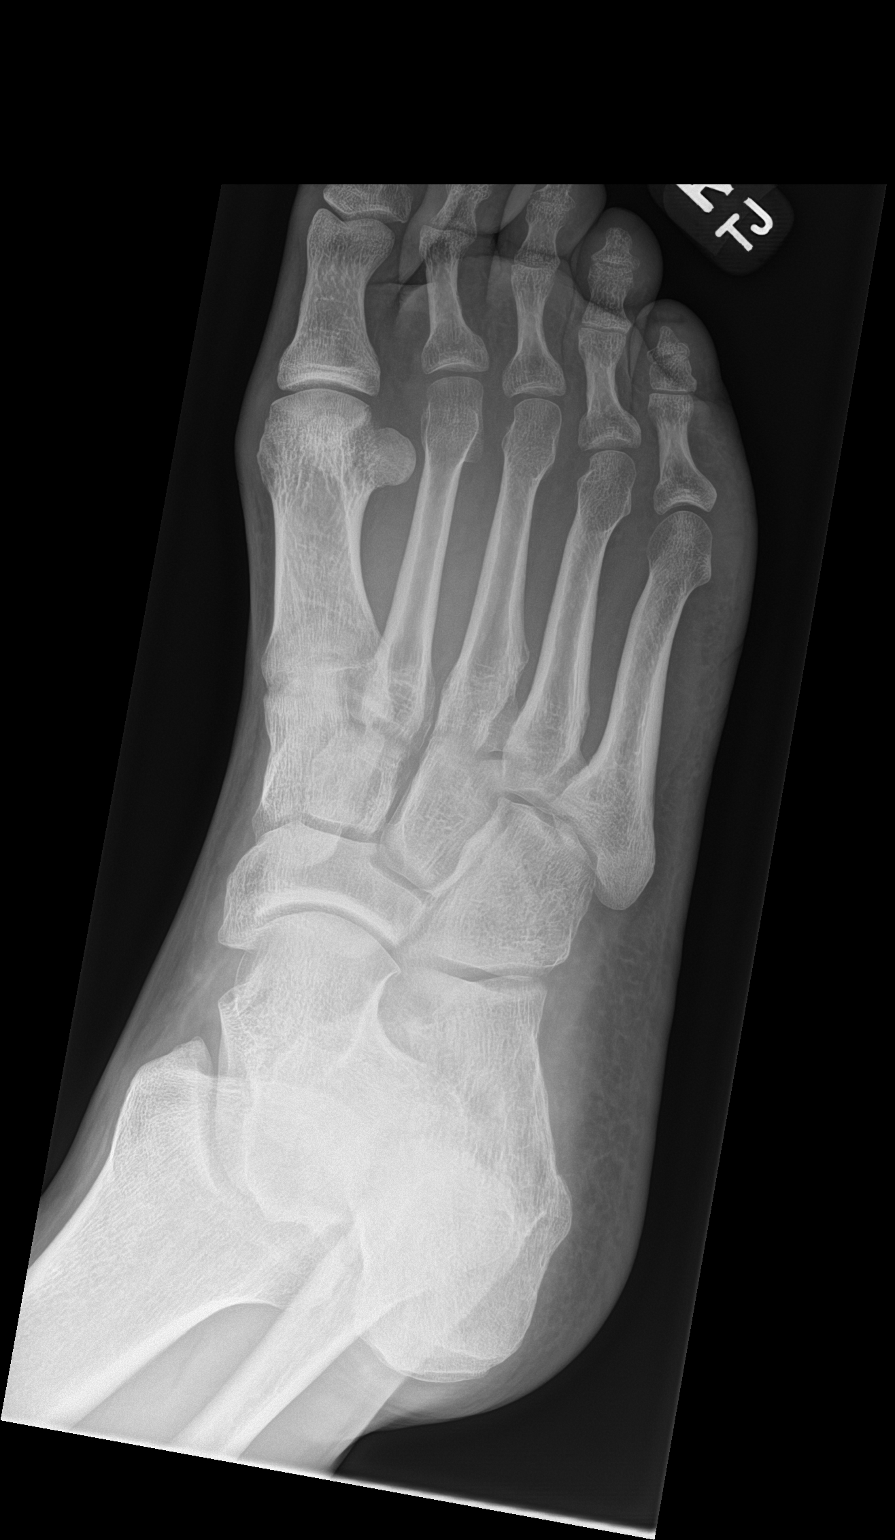

[foot lat]
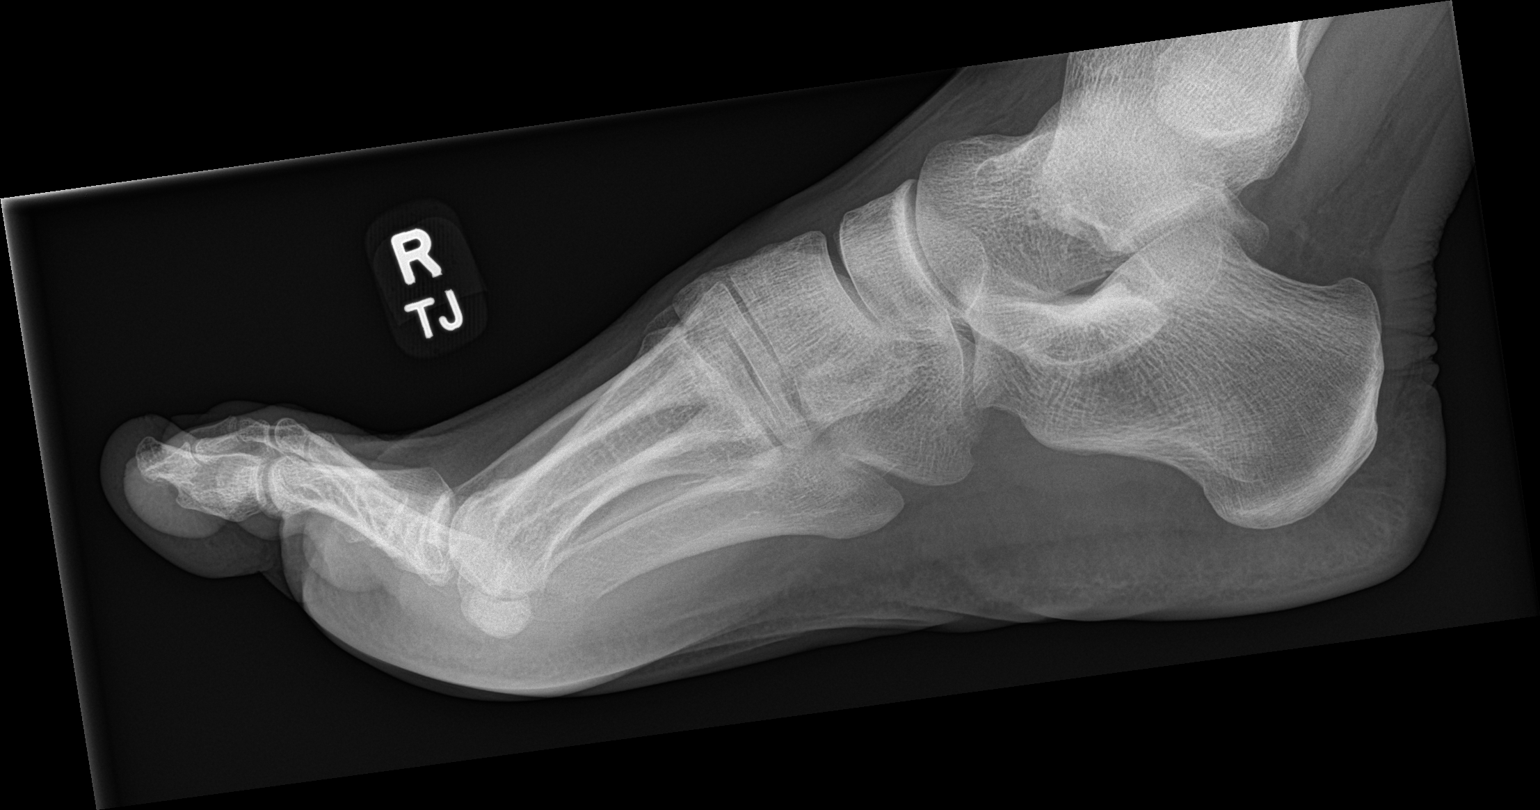

[3 of 3 positions shown; findings below may reference images not displayed]

FINDINGS: There is no evidence of fracture or dislocation. Somewhat lucent
second through fourth metatarsal heads which is unchanged and
without cortical erosion. Mild spurring at the first MTP joint.
IMPRESSION: Stable foot radiograph.  No visible stress fracture.

## 2022-02-13 DIAGNOSIS — Z79899 Other long term (current) drug therapy: Secondary | ICD-10-CM | POA: Diagnosis not present

## 2022-02-13 DIAGNOSIS — L400 Psoriasis vulgaris: Secondary | ICD-10-CM | POA: Diagnosis not present

## 2022-07-20 ENCOUNTER — Other Ambulatory Visit: Payer: Self-pay | Admitting: Physician Assistant

## 2022-07-20 ENCOUNTER — Ambulatory Visit
Admission: RE | Admit: 2022-07-20 | Discharge: 2022-07-20 | Disposition: A | Discharge status: Home / Self Care | Payer: No Typology Code available for payment source | Source: Ambulatory Visit | Attending: Physician Assistant | Admitting: Physician Assistant

## 2022-07-20 DIAGNOSIS — S6721XA Crushing injury of right hand, initial encounter: Secondary | ICD-10-CM | POA: Diagnosis present

## 2022-07-20 DIAGNOSIS — M7989 Other specified soft tissue disorders: Secondary | ICD-10-CM | POA: Diagnosis present

## 2022-08-20 DIAGNOSIS — L818 Other specified disorders of pigmentation: Secondary | ICD-10-CM | POA: Diagnosis not present

## 2022-08-20 DIAGNOSIS — Z79899 Other long term (current) drug therapy: Secondary | ICD-10-CM | POA: Diagnosis not present

## 2022-08-20 DIAGNOSIS — L400 Psoriasis vulgaris: Secondary | ICD-10-CM | POA: Diagnosis not present

## 2023-04-01 ENCOUNTER — Encounter: Payer: Self-pay | Admitting: Internal Medicine

## 2023-04-01 ENCOUNTER — Ambulatory Visit (INDEPENDENT_AMBULATORY_CARE_PROVIDER_SITE_OTHER): Payer: BC Managed Care – PPO | Admitting: Internal Medicine

## 2023-04-01 VITALS — BP 110/78 | HR 64 | Temp 97.7°F | Ht 72.5 in | Wt 229.0 lb

## 2023-04-01 DIAGNOSIS — Z1211 Encounter for screening for malignant neoplasm of colon: Secondary | ICD-10-CM

## 2023-04-01 DIAGNOSIS — Z0000 Encounter for general adult medical examination without abnormal findings: Secondary | ICD-10-CM | POA: Diagnosis not present

## 2023-04-01 DIAGNOSIS — L409 Psoriasis, unspecified: Secondary | ICD-10-CM | POA: Diagnosis not present

## 2023-04-01 DIAGNOSIS — F900 Attention-deficit hyperactivity disorder, predominantly inattentive type: Secondary | ICD-10-CM | POA: Diagnosis not present

## 2023-04-01 LAB — COMPREHENSIVE METABOLIC PANEL
ALT: 16 U/L (ref 0–53)
AST: 17 U/L (ref 0–37)
Albumin: 4.2 g/dL (ref 3.5–5.2)
Alkaline Phosphatase: 66 U/L (ref 39–117)
BUN: 9 mg/dL (ref 6–23)
CO2: 26 mEq/L (ref 19–32)
Calcium: 9.3 mg/dL (ref 8.4–10.5)
Chloride: 107 mEq/L (ref 96–112)
Creatinine, Ser: 1.22 mg/dL (ref 0.40–1.50)
GFR: 70.26 mL/min (ref >60.00)
Glucose, Bld: 80 mg/dL (ref 70–99)
Potassium: 3.9 mEq/L (ref 3.5–5.1)
Sodium: 141 mEq/L (ref 135–145)
Total Bilirubin: 0.7 mg/dL (ref 0.2–1.2)
Total Protein: 7.7 g/dL (ref 6.0–8.3)

## 2023-04-01 LAB — CBC
HCT: 42.7 % (ref 39.0–52.0)
Hemoglobin: 14.3 g/dL (ref 13.0–17.0)
MCHC: 33.5 g/dL (ref 30.0–36.0)
MCV: 98.4 fl (ref 78.0–100.0)
Platelets: 296 10*3/uL (ref 150.0–400.0)
RBC: 4.34 Mil/uL (ref 4.22–5.81)
RDW: 13.4 % (ref 11.5–15.5)
WBC: 5.3 10*3/uL (ref 4.0–10.5)

## 2023-04-01 MED ORDER — AMPHETAMINE-DEXTROAMPHETAMINE 10 MG PO TABS: 10.0000 mg | ORAL_TABLET | Freq: Two times a day (BID) | ORAL | 0 refills | Status: DC

## 2023-04-01 MED ORDER — SILDENAFIL CITRATE 20 MG PO TABS: 60.0000 mg | ORAL_TABLET | Freq: Every day | ORAL | 11 refills | Status: DC | PRN

## 2023-04-01 NOTE — Assessment & Plan Note (Signed)
Healthy Discussed lifestyle--has picked up on exercise, now needs better eating Not excited about flu/COVID vaccines--recommended Will refer for colonoscopy Defer PSA for now

## 2023-04-01 NOTE — Progress Notes (Signed)
Subjective:    Patient ID: Jose Lee, male    DOB: 1976/04/29, 47 y.o.   MRN: [id number]  HPI Here for physical  Doing okay Wants to be sure he is doing all he can for maintaining health Did have an injury--sidelined him some. Now exercising/walking 5 days a week Does have some sugared drinks, some fast food, does snack  Current Outpatient Medications on File Prior to Visit  Medication Sig Dispense Refill   Clobetasol Propionate Emulsion 0.05 % topical foam Apply topically 2 (two) times daily as needed.     SKYRIZI PEN 150 MG/ML SOAJ every 3 (three) months.      sildenafil (REVATIO) 20 MG tablet Take 3-5 tablets (60-100 mg total) by mouth daily as needed. (Patient not taking: Reported on 04/01/2023) 50 tablet 11   No current facility-administered medications on file prior to visit.    No Known Allergies  Past Medical History:  Diagnosis Date   ADHD    Psoriasis     Past Surgical History:  Procedure Laterality Date   right hand surgery     TESTICLE TORSION REDUCTION  age 45    Family History  Problem Relation Age of Onset   Diabetes Mother    Hypertension Father    Heart disease Father    Obesity Brother    Cancer Neg Hx     Social History   Socioeconomic History   Marital status: Married    Spouse name: Not on file   Number of children: 2   Years of education: Not on file   Highest education level: Not on file  Occupational History   Occupation: Radio producer    Comment: [employer]  Tobacco Use   Smoking status: Never    Passive exposure: Never   Smokeless tobacco: Never  Substance and Sexual Activity   Alcohol use: Yes    Comment: occasional   Drug use: Not on file   Sexual activity: Not on file  Other Topics Concern   Not on file  Social History Narrative   Not on file   Social Determinants of Health   Financial Resource Strain: Not on file  Food Insecurity: Not on file  Transportation Needs: Not on file  Physical Activity: Not on file   Stress: Not on file  Social Connections: Not on file  Intimate Partner Violence: Not on file   Review of Systems  Constitutional:  Negative for fatigue.       Weight is drifting up Wears seat belt  HENT:  Negative for hearing loss and tinnitus.        Has had some periodontal problems--attending to this  Eyes:  Negative for visual disturbance.       No diplopia or unilateral vision loss  Respiratory:  Negative for cough, chest tightness and shortness of breath.   Cardiovascular:  Negative for chest pain, palpitations and leg swelling.  Gastrointestinal:  Negative for blood in stool and constipation.       No heartburn  Endocrine: Negative for polydipsia and polyuria.  Genitourinary:  Negative for difficulty urinating and urgency.       Sildenafil helps  Musculoskeletal:  Negative for arthralgias and joint swelling.       Had back sprain a few months ago--resolved (can still feel it in the rain)  Skin:        Psoriasis controlled with skyrizi  Allergic/Immunologic: Positive for environmental allergies. Negative for immunocompromised state.       No meds  Neurological:  Negative for dizziness, syncope, light-headedness and headaches.       Some left thigh numbness--reviewed meralgia paresthetica  Hematological:  Negative for adenopathy. Does not bruise/bleed easily.  Psychiatric/Behavioral:  Negative for dysphoric mood and sleep disturbance. The patient is not nervous/anxious.        Objective:   Physical Exam Constitutional:      Appearance: Normal appearance.  HENT:     Mouth/Throat:     Pharynx: No oropharyngeal exudate or posterior oropharyngeal erythema.  Eyes:     Conjunctiva/sclera: Conjunctivae normal.     Pupils: Pupils are equal, round, and reactive to light.  Cardiovascular:     Rate and Rhythm: Normal rate and regular rhythm.     Pulses: Normal pulses.     Heart sounds: No murmur heard.    No gallop.  Pulmonary:     Effort: Pulmonary effort is normal.      Breath sounds: Normal breath sounds. No wheezing or rales.  Abdominal:     Palpations: Abdomen is soft.     Tenderness: There is no abdominal tenderness.  Musculoskeletal:     Cervical back: Neck supple.     Right lower leg: No edema.     Left lower leg: No edema.  Lymphadenopathy:     Cervical: No cervical adenopathy.  Skin:    Findings: No lesion or rash.  Neurological:     General: No focal deficit present.     Mental Status: He is alert and oriented to person, place, and time.  Psychiatric:        Mood and Affect: Mood normal.        Behavior: Behavior normal.            Assessment & Plan:

## 2023-04-01 NOTE — Assessment & Plan Note (Signed)
Has benefited from adderall occaisonally in the past Will refill

## 2023-04-01 NOTE — Patient Instructions (Signed)
You can look up meralgia paresthetica

## 2023-04-01 NOTE — Assessment & Plan Note (Signed)
Does okay on the skyrizi

## 2023-05-12 ENCOUNTER — Encounter: Payer: Self-pay | Admitting: Gastroenterology

## 2023-05-29 DIAGNOSIS — M25641 Stiffness of right hand, not elsewhere classified: Secondary | ICD-10-CM | POA: Insufficient documentation

## 2023-06-21 ENCOUNTER — Other Ambulatory Visit: Payer: Self-pay | Admitting: Internal Medicine

## 2023-06-21 MED ORDER — AMPHETAMINE-DEXTROAMPHETAMINE 10 MG PO TABS: 10.0000 mg | ORAL_TABLET | Freq: Two times a day (BID) | ORAL | 0 refills | Status: DC

## 2023-06-21 NOTE — Telephone Encounter (Signed)
Last written 04-01-23 #60 Last OV 04-01-23 Next [facility]-24 [facility] [address]

## 2023-07-02 ENCOUNTER — Ambulatory Visit (AMBULATORY_SURGERY_CENTER): Payer: BC Managed Care – PPO | Admitting: *Deleted

## 2023-07-02 VITALS — Ht 72.0 in | Wt 215.0 lb

## 2023-07-02 DIAGNOSIS — Z1211 Encounter for screening for malignant neoplasm of colon: Secondary | ICD-10-CM

## 2023-07-02 MED ORDER — NA SULFATE-K SULFATE-MG SULF 17.5-3.13-1.6 GM/177ML PO SOLN: 1.0000 | Freq: Once | ORAL | 0 refills | Status: DC

## 2023-07-02 NOTE — Progress Notes (Signed)
Pt's name and DOB verified at the beginning of the pre-visit.  Pt denies any difficulty with ambulating,sitting, laying down or rolling side to side Gave both [facility] main # and MD on call # prior to instructions.  No egg or soy allergy known to patient  No issues known to pt with past sedation with any surgeries or procedures Pt denies having issues being intubated Pt has no issues moving head neck or swallowing No FH of Malignant Hyperthermia Pt is not on diet pills Pt is not on home 02  Pt is not on blood thinners  Pt denies issues with constipation  Pt is not on dialysis Pt denise any abnormal heart rhythms  Pt denies any upcoming cardiac testing Pt encouraged to use to use Singlecare or Goodrx to reduce cost  Patient's chart reviewed by Jose Lee CNRA prior to pre-visit and patient appropriate for the [facility].  Pre-visit completed and red dot placed by patient's name on their procedure day (on provider's schedule).  . Visit by phone Pt states weight is 215 lb Instructed pt why it is important to and  to call if they have any changes in health or new medications. Directed them to the # given and on instructions.   Pt states they will.  Instructions reviewed with pt and pt states understanding. Instructed to review again prior to procedure. Pt states they will.  Instructions sent by mail with coupon and by my chart

## 2023-07-06 DIAGNOSIS — Z79899 Other long term (current) drug therapy: Secondary | ICD-10-CM | POA: Diagnosis not present

## 2023-07-21 ENCOUNTER — Encounter: Payer: Self-pay | Admitting: Gastroenterology

## 2023-07-29 ENCOUNTER — Telehealth: Payer: Self-pay | Admitting: Gastroenterology

## 2023-07-29 ENCOUNTER — Other Ambulatory Visit: Payer: Self-pay

## 2023-07-29 DIAGNOSIS — Z1211 Encounter for screening for malignant neoplasm of colon: Secondary | ICD-10-CM

## 2023-07-29 MED ORDER — NA SULFATE-K SULFATE-MG SULF 17.5-3.13-1.6 GM/177ML PO SOLN: 1.0000 | Freq: Once | ORAL | 0 refills | Status: DC

## 2023-07-29 MED ORDER — NA SULFATE-K SULFATE-MG SULF 17.5-3.13-1.6 GM/177ML PO SOLN: 1.0000 | Freq: Once | ORAL | 0 refills | Status: AC

## 2023-07-29 NOTE — Telephone Encounter (Signed)
Rx sent sent to [facility] in [place] per request

## 2023-07-29 NOTE — Telephone Encounter (Signed)
Inbound call from patient stating he has not received prep medication for 9/27 colonoscopy. Please advise, thank you.

## 2023-07-30 ENCOUNTER — Ambulatory Visit (AMBULATORY_SURGERY_CENTER): Payer: BC Managed Care – PPO | Admitting: Gastroenterology

## 2023-07-30 ENCOUNTER — Encounter: Payer: Self-pay | Admitting: Gastroenterology

## 2023-07-30 VITALS — BP 92/58 | HR 62 | Temp 96.8°F | Resp 16 | Ht 72.5 in | Wt 215.0 lb

## 2023-07-30 DIAGNOSIS — Z1211 Encounter for screening for malignant neoplasm of colon: Secondary | ICD-10-CM

## 2023-07-30 DIAGNOSIS — K635 Polyp of colon: Secondary | ICD-10-CM | POA: Diagnosis not present

## 2023-07-30 DIAGNOSIS — K640 First degree hemorrhoids: Secondary | ICD-10-CM

## 2023-07-30 DIAGNOSIS — D124 Benign neoplasm of descending colon: Secondary | ICD-10-CM

## 2023-07-30 MED ORDER — SODIUM CHLORIDE 0.9 % IV SOLN: 500.0000 mL | Freq: Once | INTRAVENOUS | Status: DC

## 2023-07-30 NOTE — Patient Instructions (Addendum)
Recommendation:           - Patient has a contact number available for                            emergencies. The signs and symptoms of potential                            delayed complications were discussed with the                            patient. Return to normal activities tomorrow.                            Written discharge instructions were provided to the                            patient.                           - Resume previous diet.                           - Continue present medications.                           - Await pathology results.                           - Repeat colonoscopy for surveillance based on                            pathology results.                           - Return to GI clinic PRN.  Handouts on polyps and hemorrhoids given.  YOU HAD AN ENDOSCOPIC PROCEDURE TODAY AT THE [facility]:   Refer to the procedure report that was given to you for any specific questions about what was found during the examination.  If the procedure report does not answer your questions, please call your gastroenterologist to clarify.  If you requested that your care partner not be given the details of your procedure findings, then the procedure report has been included in a sealed envelope for you to review at your convenience later.  YOU SHOULD EXPECT: Some feelings of bloating in the abdomen. Passage of more gas than usual.  Walking can help get rid of the air that was put into your GI tract during the procedure and reduce the bloating. If you had a lower endoscopy (such as a colonoscopy or flexible sigmoidoscopy) you may notice spotting of blood in your stool or on the toilet paper. If you underwent a bowel prep for your procedure, you may not have a normal bowel movement for a few days.  Please Note:  You might notice some irritation and congestion in your nose or some drainage.  This is from the oxygen used during your procedure.  There is no need for  concern and it should clear up in a day or so.  SYMPTOMS TO REPORT IMMEDIATELY:  Following lower  endoscopy (colonoscopy or flexible sigmoidoscopy):  Excessive amounts of blood in the stool  Significant tenderness or worsening of abdominal pains  Swelling of the abdomen that is new, acute  Fever of 100F or higher   For urgent or emergent issues, a gastroenterologist can be reached at any hour by calling [phone]. Do not use MyChart messaging for urgent concerns.    DIET:  We do recommend a small meal at first, but then you may proceed to your regular diet.  Drink plenty of fluids but you should avoid alcoholic beverages for 24 hours.  ACTIVITY:  You should plan to take it easy for the rest of today and you should NOT DRIVE or use heavy machinery until tomorrow (because of the sedation medicines used during the test).    FOLLOW UP: Our staff will call the number listed on your records the next business day following your procedure.  We will call around 7:15- 8:00 am to check on you and address any questions or concerns that you may have regarding the information given to you following your procedure. If we do not reach you, we will leave a message.     If any biopsies were taken you will be contacted by phone or by letter within the next 1-3 weeks.  Please call us at [phone] if you have not heard about the biopsies in 3 weeks.    SIGNATURES/CONFIDENTIALITY: You and/or your care partner have signed paperwork which will be entered into your electronic medical record.  These signatures attest to the fact that that the information above on your After Visit Summary has been reviewed and is understood.  Full responsibility of the confidentiality of this discharge information lies with you and/or your care-partner.

## 2023-07-30 NOTE — Progress Notes (Signed)
Cell phone , watch off per pt   Cell phone off per pt

## 2023-07-30 NOTE — Progress Notes (Signed)
Report to PACU, RN, vss, BBS= Clear.  

## 2023-07-30 NOTE — Progress Notes (Signed)
GASTROENTEROLOGY PROCEDURE H&P NOTE   Primary Care Physician: Karie Schwalbe, MD    Reason for Procedure:  Colon Cancer screening  Plan:    Colonoscopy  Patient is appropriate for endoscopic procedure(s) in the ambulatory ([facility]) setting.  The nature of the procedure, as well as the risks, benefits, and alternatives were carefully and thoroughly reviewed with the patient. Ample time for discussion and questions allowed. The patient understood, was satisfied, and agreed to proceed.     HPI: Jose Lee is a 47 y.o. male who presents for colonoscopy for routine Colon Cancer screening.  No active GI symptoms.  No known family history of colon cancer or related malignancy.  Patient is otherwise without complaints or active issues today.  Past Medical History:  Diagnosis Date   ADHD    Psoriasis     Past Surgical History:  Procedure Laterality Date   right hand surgery     2024   TESTICLE TORSION REDUCTION  age 2    Prior to Admission medications   Medication Sig Start Date End Date Taking? Authorizing Provider  amphetamine-dextroamphetamine (ADDERALL) 10 MG tablet Take 1 tablet (10 mg total) by mouth 2 (two) times daily with a meal. 06/21/23   Karie Schwalbe, MD  Clobetasol Propionate Emulsion 0.05 % topical foam Apply topically 2 (two) times daily as needed. 04/29/20   [provider]  sildenafil (REVATIO) 20 MG tablet Take 3-5 tablets (60-100 mg total) by mouth daily as needed. 04/01/23   Karie Schwalbe, MD  SKYRIZI PEN 150 MG/ML SOAJ every 3 (three) months.  08/20/20   [provider]    Current Outpatient Medications  Medication Sig Dispense Refill   amphetamine-dextroamphetamine (ADDERALL) 10 MG tablet Take 1 tablet (10 mg total) by mouth 2 (two) times daily with a meal. 60 tablet 0   Clobetasol Propionate Emulsion 0.05 % topical foam Apply topically 2 (two) times daily as needed.     sildenafil (REVATIO) 20 MG tablet Take 3-5 tablets  (60-100 mg total) by mouth daily as needed. 50 tablet 11   SKYRIZI PEN 150 MG/ML SOAJ every 3 (three) months.      Current Facility-Administered Medications  Medication Dose Route Frequency Provider Last Rate Last Admin   0.9 %  sodium chloride infusion  500 mL Intravenous Once Kerianne Gurr V, DO        Allergies as of 07/30/2023   (No Known Allergies)    Family History  Problem Relation Age of Onset   Diabetes Mother    Hypertension Father    Heart disease Father    Obesity Brother    Cancer Neg Hx    Colon cancer Neg Hx    Colon polyps Neg Hx    Esophageal cancer Neg Hx    Rectal cancer Neg Hx    Stomach cancer Neg Hx     Social History   Socioeconomic History   Marital status: Married    Spouse name: Not on file   Number of children: 2   Years of education: Not on file   Highest education level: Not on file  Occupational History   Occupation: Radio producer    Comment: [employer]  Tobacco Use   Smoking status: Never    Passive exposure: Never   Smokeless tobacco: Never  Substance and Sexual Activity   Alcohol use: Yes    Comment: occasional   Drug use: Never   Sexual activity: Not on file  Other Topics Concern  Not on file  Social History Narrative   Not on file   Social Determinants of Health   Financial Resource Strain: Not on file  Food Insecurity: Not on file  Transportation Needs: Not on file  Physical Activity: Not on file  Stress: Not on file  Social Connections: Not on file  Intimate Partner Violence: Not on file    Physical Exam: Vital signs in last 24 hours: @BP  126/65   Pulse (!) 58   Temp (!) 96.8 F (36 C) (Skin)   Ht 6' 0.5" (1.842 m)   Wt 215 lb (97.5 kg)   SpO2 99%   BMI 28.76 kg/m  GEN: NAD EYE: Sclerae anicteric ENT: MMM CV: Non-tachycardic Pulm: CTA b/l GI: Soft, NT/ND NEURO:  Alert & Oriented x 3   Doristine Locks, DO [facility]   07/30/2023 8:25 AM

## 2023-07-30 NOTE — Progress Notes (Signed)
Called to room to assist during endoscopic procedure.  Patient ID and intended procedure confirmed with present staff. Received instructions for my participation in the procedure from the performing physician.  

## 2023-07-30 NOTE — Op Note (Signed)
[facility] Patient Name: Jose Lee Procedure Date: 07/30/2023 8:59 AM MRN: [id number] Endoscopist: Doristine Locks , MD, [id number] Age: 47 Referring MD:  Date of Birth: Jul 20, 1976 Gender: Male Account #: [id number] Procedure:                Colonoscopy Indications:              Screening for colorectal malignant neoplasm, This                            is the patient's first colonoscopy Medicines:                Monitored Anesthesia Care Procedure:                Pre-Anesthesia Assessment:                           - Prior to the procedure, a History and Physical                            was performed, and patient medications and                            allergies were reviewed. The patient's tolerance of                            previous anesthesia was also reviewed. The risks                            and benefits of the procedure and the sedation                            options and risks were discussed with the patient.                            All questions were answered, and informed consent                            was obtained. Prior Anticoagulants: The patient has                            taken no anticoagulant or antiplatelet agents. ASA                            Grade Assessment: II - A patient with mild systemic                            disease. After reviewing the risks and benefits,                            the patient was deemed in satisfactory condition to                            undergo the procedure.  After obtaining informed consent, the colonoscope                            was passed under direct vision. Throughout the                            procedure, the patient's blood pressure, pulse, and                            oxygen saturations were monitored continuously. The                            Olympus CF-HQ190L ([id number]) Colonoscope was                            introduced through the  anus and advanced to the the                            cecum, identified by appendiceal orifice and                            ileocecal valve. The colonoscopy was performed                            without difficulty. The patient tolerated the                            procedure well. The quality of the bowel                            preparation was good. The ileocecal valve,                            appendiceal orifice, and rectum were photographed. Scope In: 9:05:54 AM Scope Out: 9:18:49 AM Scope Withdrawal Time: 0 hours 9 minutes 48 seconds  Total Procedure Duration: 0 hours 12 minutes 55 seconds  Findings:                 The perianal and digital rectal examinations were                            normal.                           A 5 mm polyp was found in the descending colon. The                            polyp was sessile. The polyp was removed with a                            cold snare. Resection and retrieval were complete.                            Estimated blood loss was minimal.  The exam was otherwise normal throughout the                            remainder of the colon.                           Non-bleeding internal hemorrhoids were found during                            retroflexion. The hemorrhoids were small. Complications:            No immediate complications. Estimated Blood Loss:     Estimated blood loss was minimal. Impression:               - One 5 mm polyp in the descending colon, removed                            with a cold snare. Resected and retrieved.                           - Non-bleeding internal hemorrhoids.                           - Remainder of the colon was normal appearing. Recommendation:           - Patient has a contact number available for                            emergencies. The signs and symptoms of potential                            delayed complications were discussed with the                             patient. Return to normal activities tomorrow.                            Written discharge instructions were provided to the                            patient.                           - Resume previous diet.                           - Continue present medications.                           - Await pathology results.                           - Repeat colonoscopy for surveillance based on                            pathology results.                           -  Return to GI clinic PRN. Doristine Locks, MD 07/30/2023 9:23:27 AM

## 2023-08-02 ENCOUNTER — Telehealth: Payer: Self-pay

## 2023-08-02 NOTE — Telephone Encounter (Signed)
No answer, left message to call if having any issues or concerns, B.Lj Miyamoto RN 

## 2023-08-03 LAB — SURGICAL PATHOLOGY

## 2023-08-10 DIAGNOSIS — B351 Tinea unguium: Secondary | ICD-10-CM | POA: Diagnosis not present

## 2023-08-10 DIAGNOSIS — L400 Psoriasis vulgaris: Secondary | ICD-10-CM | POA: Diagnosis not present

## 2023-08-10 DIAGNOSIS — Z79899 Other long term (current) drug therapy: Secondary | ICD-10-CM | POA: Diagnosis not present

## 2023-10-14 ENCOUNTER — Other Ambulatory Visit: Payer: Self-pay | Admitting: Internal Medicine

## 2023-10-14 MED ORDER — AMPHETAMINE-DEXTROAMPHETAMINE 10 MG PO TABS: 10.0000 mg | ORAL_TABLET | Freq: Two times a day (BID) | ORAL | 0 refills | Status: DC

## 2023-10-14 NOTE — Telephone Encounter (Signed)
Last filled 06-21-23 #60 Last OV 04-01-23 [facility] [address]

## 2023-10-14 NOTE — Telephone Encounter (Signed)
Prescription Request  10/14/2023  LOV: 04/01/2023  What is the name of the medication or equipment? amphetamine-dextroamphetamine (ADDERALL) 10 MG tablet   Have you contacted your pharmacy to request a refill? No   Which pharmacy would you like this sent to?   [id number] [place], [place] - [address] [place] [address] Phone: [phone] Fax: [phone]     Patient notified that their request is being sent to the clinical staff for review and that they should receive a response within 2 business days.   Please advise at Mobile [phone] (mobile)

## 2023-10-29 ENCOUNTER — Other Ambulatory Visit: Payer: Self-pay | Admitting: Internal Medicine

## 2023-10-29 NOTE — Telephone Encounter (Signed)
Copied from CRM [id number]. Topic: Clinical - Medication Refill >> Oct 29, 2023  1:45 PM Kathryne Eriksson wrote: Most Recent Primary Care Visit:  Provider: Tillman Abide I  Department: [facility]  Visit Type: PHYSICAL  Date: 04/01/2023  Medication: amphetamine-dextroamphetamine (ADDERALL) 10 MG tablet  Has the patient contacted their pharmacy? Yes (Agent: If no, request that the patient contact the pharmacy for the refill. If patient does not wish to contact the pharmacy document the reason why and proceed with request.) (Agent: If yes, when and what did the pharmacy advise?)  Is this the correct pharmacy for this prescription? Yes If no, delete pharmacy and type the correct one.  This is the patient's preferred pharmacy:  [facility] [place], [place] - [address] [facility] AT Portal to Registered Caremark Sites [address] [place] [address] Phone: [phone] Fax: [phone]  [id number] [place], [place] - [address] [place] [address] Phone: [phone] Fax: [phone]  [facility] [address] [facility] - [place], [place] - [address] [facility] AT [facility] [address] [facility] [address] [address] [place] [address] Phone: [phone] Fax: [phone]  [id number], [place] - [address] AT [facility] [address] [facility] [place] [address] Phone: [phone] Fax: [phone]   Has the prescription been filled recently? Yes  Is the patient out of the medication? Yes  Has the patient been seen for an appointment in the last year OR does the patient have an upcoming appointment? Yes  Can we respond through MyChart? Yes  Agent: Please be advised that Rx refills may take up to 3 business days. We ask that you follow-up with your pharmacy.

## 2023-10-29 NOTE — Telephone Encounter (Signed)
Reason for CRM: PT called back and verified pharmacy for med refill    [id number] [place], [place] - [address]    [address] [place] [address]    Phone: [phone] Fax: [phone]

## 2023-10-29 NOTE — Addendum Note (Signed)
Addended by: Eual Fines on: 10/29/2023 03:09 PM   Modules accepted: Orders

## 2023-10-29 NOTE — Telephone Encounter (Signed)
Left message on VM per DPR asking pt which pharmacy he wanted his medication sent to as 4 pharmacies are listed in the message.

## 2023-10-30 MED ORDER — AMPHETAMINE-DEXTROAMPHETAMINE 10 MG PO TABS: 10.0000 mg | ORAL_TABLET | Freq: Two times a day (BID) | ORAL | 0 refills | Status: DC

## 2023-10-30 NOTE — Addendum Note (Signed)
Addended by: Tillman Abide I on: 10/30/2023 09:29 AM   Modules accepted: Orders

## 2024-01-03 ENCOUNTER — Encounter: Payer: Self-pay | Admitting: Podiatry

## 2024-01-03 ENCOUNTER — Ambulatory Visit: Payer: BC Managed Care – PPO | Admitting: Podiatry

## 2024-01-03 ENCOUNTER — Ambulatory Visit (INDEPENDENT_AMBULATORY_CARE_PROVIDER_SITE_OTHER)

## 2024-01-03 DIAGNOSIS — M722 Plantar fascial fibromatosis: Secondary | ICD-10-CM | POA: Diagnosis not present

## 2024-01-03 DIAGNOSIS — M779 Enthesopathy, unspecified: Secondary | ICD-10-CM

## 2024-01-03 DIAGNOSIS — M62461 Contracture of muscle, right lower leg: Secondary | ICD-10-CM

## 2024-01-03 DIAGNOSIS — M7741 Metatarsalgia, right foot: Secondary | ICD-10-CM

## 2024-01-03 DIAGNOSIS — M62462 Contracture of muscle, left lower leg: Secondary | ICD-10-CM

## 2024-01-03 DIAGNOSIS — M7742 Metatarsalgia, left foot: Secondary | ICD-10-CM

## 2024-01-03 MED ORDER — MELOXICAM 15 MG PO TABS: 15.0000 mg | ORAL_TABLET | Freq: Every day | ORAL | 0 refills | Status: DC

## 2024-01-03 NOTE — Progress Notes (Signed)
 Subjective:  Patient ID: Jose Lee, male    DOB: 1976/03/09,  MRN: [id number]  Chief Complaint  Patient presents with   Foot Pain    "I have pain from my heel all around the side of my foot.  My metatarsal still bothers me too."    Discussed the use of AI scribe software for clinical note transcription with the patient, who gave verbal consent to proceed.  History of Present Illness   Jose Lee is a 48 year old male who presents with acute heel pain.  He has been experiencing acute heel pain that began two Saturdays ago. The pain was severe initially, affecting the heel and radiating to the toes, and he was unable to bear weight on the heel. The pain 'came out of nowhere' without any preceding injury. Over the course of a week, the pain improved partially, but discomfort persists.  He has a history of metatarsal stress reaction and metarsalgia treated by Sports med and had a surgery consult with Ortho, these issues persisting for the past three years, which initially caused significant pain and swelling, leading to a three-month work absence. He used insoles for a period following this episode but discontinued them after six months. The condition has not worsened but has not completely resolved either.  He typically wears steel-toed shoes for work and rotates between different pairs. He does not use insoles currently and has not had custom orthotics. He previously considered surgical intervention for his metatarsal issues but was deterred by the lengthy recovery period.  He denies any current medication use for pain and has not taken anti-inflammatories recently. He recalls a previous painful injection for his foot condition and is reluctant to repeat this treatment. He has experienced back pain in the past, which he attributes to compensating for foot pain.  No pain in the back of the heel. Mild soreness on the lateral side of the foot and an aching sensation in the palm of  the foot, characterized as discomfort rather than pain. No pain in the toes.          Objective:    Physical Exam   CARDIOVASCULAR: Normal pulses and capillary refill time. MUSCULOSKELETAL: Mild tenderness on second and third metatarsal heads, mild pain on lateral band of plantar fascia, significant gastrocnemius equinus, and mild pes planus deformity. SKIN: Normal skin texture and temperature.       No images are attached to the encounter.    Results   RADIOLOGY Foot X-ray: No acute fracture, stress fracture, degenerative changes, or heel spur. Mild pes planus deformity. (01/03/2024)      Assessment:   1. Metatarsalgia of both feet   2. Plantar fasciitis of right foot   3. Gastrocnemius equinus of left lower extremity   4. Gastrocnemius equinus of right lower extremity      Plan:  Patient was evaluated and treated and all questions answered.  Assessment and Plan    Plantar Fasciitis Acute onset of heel pain radiating to toes, with tenderness on palpation of the lateral band of the plantar fascia. Likely secondary to tightness in the calf muscle and exacerbated by flat feet and metatarsal issues. -Prescribe Meloxicam for anti-inflammatory effect for 3-4 weeks. -Recommend home therapy exercises twice daily to stretch calf muscle and alleviate strain on plantar fascia.  Metatarsalgia Chronic discomfort in the metatarsals, with mild tenderness on palpation of the second and third metatarsal heads. Patient has previously been advised surgical intervention but prefers non-surgical management. -Recommend  custom orthotics with metatarsal pad and unload under second and third metatarsals to redistribute pressure. -Schedule appointment with [name] for orthotic fitting.  Follow-up in 3 months to assess response to treatment. If symptoms worsen, patient to return sooner.          Return in about 3 months (around 04/04/2024) for f/u plantar fasciitis / metatarsalgia /  orthotics.

## 2024-01-03 NOTE — Patient Instructions (Signed)

## 2024-02-02 ENCOUNTER — Other Ambulatory Visit: Payer: Self-pay | Admitting: Podiatry

## 2024-03-03 ENCOUNTER — Ambulatory Visit

## 2024-03-03 NOTE — Progress Notes (Unsigned)
 Patient was here and evaluated and scanned for BIL CFO's  Metatarsalgia present Right > than left  Prominent met heads  CFO's will provide total contact to BIL MLA's helping to distribute body weight more evenly to greater reduce plantar pressure and pain  MT pads and 2-3 Balance pads to be added for greater offload Financial form signed Britton Cane Cped, CFo, CFm

## 2024-03-17 ENCOUNTER — Telehealth: Payer: Self-pay

## 2024-03-17 NOTE — Telephone Encounter (Signed)
 Pt has an appt in [place] on 04/05/24.Jose Lee per his appt notes his orthotics will be fitted that day. Orthotics put in [place] box on 03/17/24

## 2024-04-03 ENCOUNTER — Ambulatory Visit (INDEPENDENT_AMBULATORY_CARE_PROVIDER_SITE_OTHER): Payer: BC Managed Care – PPO | Admitting: Internal Medicine

## 2024-04-03 ENCOUNTER — Encounter: Payer: Self-pay | Admitting: Internal Medicine

## 2024-04-03 VITALS — BP 100/70 | HR 78 | Temp 98.5°F | Ht 72.5 in | Wt 220.0 lb

## 2024-04-03 DIAGNOSIS — I4891 Unspecified atrial fibrillation: Secondary | ICD-10-CM | POA: Insufficient documentation

## 2024-04-03 DIAGNOSIS — F900 Attention-deficit hyperactivity disorder, predominantly inattentive type: Secondary | ICD-10-CM

## 2024-04-03 DIAGNOSIS — Z1159 Encounter for screening for other viral diseases: Secondary | ICD-10-CM

## 2024-04-03 DIAGNOSIS — Z114 Encounter for screening for human immunodeficiency virus [HIV]: Secondary | ICD-10-CM | POA: Diagnosis not present

## 2024-04-03 DIAGNOSIS — L409 Psoriasis, unspecified: Secondary | ICD-10-CM

## 2024-04-03 DIAGNOSIS — I499 Cardiac arrhythmia, unspecified: Secondary | ICD-10-CM | POA: Diagnosis not present

## 2024-04-03 DIAGNOSIS — Z0000 Encounter for general adult medical examination without abnormal findings: Secondary | ICD-10-CM

## 2024-04-03 LAB — COMPREHENSIVE METABOLIC PANEL WITH GFR
ALT: 16 U/L (ref 0–53)
AST: 16 U/L (ref 0–37)
Albumin: 4.4 g/dL (ref 3.5–5.2)
Alkaline Phosphatase: 69 U/L (ref 39–117)
BUN: 15 mg/dL (ref 6–23)
CO2: 27 meq/L (ref 19–32)
Calcium: 9.6 mg/dL (ref 8.4–10.5)
Chloride: 105 meq/L (ref 96–112)
Creatinine, Ser: 1.27 mg/dL (ref 0.40–1.50)
GFR: 66.49 mL/min (ref >60.00)
Glucose, Bld: 77 mg/dL (ref 70–99)
Potassium: 4.4 meq/L (ref 3.5–5.1)
Sodium: 139 meq/L (ref 135–145)
Total Bilirubin: 0.8 mg/dL (ref 0.2–1.2)
Total Protein: 7.2 g/dL (ref 6.0–8.3)

## 2024-04-03 LAB — CBC
HCT: 46.9 % (ref 39.0–52.0)
Hemoglobin: 15.7 g/dL (ref 13.0–17.0)
MCHC: 33.5 g/dL (ref 30.0–36.0)
MCV: 96.8 fl (ref 78.0–100.0)
Platelets: 292 10*3/uL (ref 150.0–400.0)
RBC: 4.85 Mil/uL (ref 4.22–5.81)
RDW: 13.3 % (ref 11.5–15.5)
WBC: 6.1 10*3/uL (ref 4.0–10.5)

## 2024-04-03 LAB — LIPID PANEL
Cholesterol: 155 mg/dL (ref 0–200)
HDL: 47.7 mg/dL (ref >39.00)
LDL Cholesterol: 87 mg/dL (ref 0–99)
NonHDL: 106.84
Total CHOL/HDL Ratio: 3
Triglycerides: 101 mg/dL (ref 0.0–149.0)
VLDL: 20.2 mg/dL (ref 0.0–40.0)

## 2024-04-03 LAB — TSH: TSH: 1.46 u[IU]/mL (ref 0.35–5.50)

## 2024-04-03 MED ORDER — TADALAFIL 20 MG PO TABS: 10.0000 mg | ORAL_TABLET | ORAL | 11 refills | Status: AC | PRN

## 2024-04-03 MED ORDER — AMPHETAMINE-DEXTROAMPHETAMINE 10 MG PO TABS: 10.0000 mg | ORAL_TABLET | Freq: Two times a day (BID) | ORAL | 0 refills | Status: AC

## 2024-04-03 NOTE — Assessment & Plan Note (Signed)
 Generally healthy Colonoscopy due again 2029 Consider PSA next year Prefers no flu/COVID vaccines

## 2024-04-03 NOTE — Assessment & Plan Note (Addendum)
 EKG does show atrial fib. LAD but otherwise normal. No comparison Asymptomatic--so hard to tell how long this has been going on Low risk so will hold off on eliquis for now Discussed starting ASA 81mg  daily for now Will set up with cardiology

## 2024-04-03 NOTE — Assessment & Plan Note (Signed)
 No symptoms Will check EKG

## 2024-04-03 NOTE — Assessment & Plan Note (Signed)
 Does well with the skyrizi

## 2024-04-03 NOTE — Assessment & Plan Note (Signed)
Uses the adderall occasionally

## 2024-04-03 NOTE — Progress Notes (Signed)
 Subjective:    Patient ID: Jose Lee, male    DOB: [birth year], 48 y.o.   MRN: [id number]  HPI Here for physical  Doing well Did have colonoscopy  Same job Regular exercise still  Current Outpatient Medications on File Prior to Visit  Medication Sig Dispense Refill   amphetamine -dextroamphetamine  (ADDERALL) 10 MG tablet Take 1 tablet (10 mg total) by mouth 2 (two) times daily with a meal. 60 tablet 0   Clobetasol Propionate Emulsion 0.05 % topical foam Apply topically 2 (two) times daily as needed.     meloxicam  (MOBIC ) 15 MG tablet TAKE 1 TABLET (15 MG TOTAL) BY MOUTH DAILY. 30 tablet 0   sildenafil  (REVATIO ) 20 MG tablet Take 3-5 tablets (60-100 mg total) by mouth daily as needed. 50 tablet 11   SKYRIZI PEN 150 MG/ML SOAJ every 3 (three) months.      No current facility-administered medications on file prior to visit.    No Known Allergies  Past Medical History:  Diagnosis Date   ADHD    Psoriasis     Past Surgical History:  Procedure Laterality Date   right hand surgery     2024   TESTICLE TORSION REDUCTION  age 2    Family History  Problem Relation Age of Onset   Diabetes Mother    Hypertension Father    Heart disease Father    Obesity Brother    Cancer Neg Hx    Colon cancer Neg Hx    Colon polyps Neg Hx    Esophageal cancer Neg Hx    Rectal cancer Neg Hx    Stomach cancer Neg Hx     Social History   Socioeconomic History   Marital status: Married    Spouse name: Not on file   Number of children: 2   Years of education: Not on file   Highest education level: Not on file  Occupational History   Occupation: Radio producer    Comment: [employer]  Tobacco Use   Smoking status: Never    Passive exposure: Never   Smokeless tobacco: Never  Vaping Use   Vaping status: Never Used  Substance and Sexual Activity   Alcohol use: Yes    Comment: occasional   Drug use: Never   Sexual activity: Not on file  Other Topics Concern   Not on file   Social History Narrative   Not on file   Social Drivers of Health   Financial Resource Strain: Not on file  Food Insecurity: Not on file  Transportation Needs: Not on file  Physical Activity: Not on file  Stress: Not on file  Social Connections: Not on file  Intimate Partner Violence: Not on file   Review of Systems  Constitutional:  Negative for fatigue.       Wears seat belt Weight down a few pounds  HENT:  Negative for dental problem, hearing loss and tinnitus.        Keeps up with dentist  Eyes:  Negative for visual disturbance.       No diplopia or unilateral vision loss  Respiratory:  Negative for cough, chest tightness and shortness of breath.   Cardiovascular:  Negative for chest pain, palpitations and leg swelling.  Gastrointestinal:  Negative for blood in stool and constipation.       No heartburn  Endocrine: Negative for polydipsia and polyuria.  Genitourinary:  Negative for difficulty urinating and urgency.       Occ frequency--but not consistent Did  see blood clot once--when had stopped the urine stream by squeezing penis (kids walking in the door). No recurrence Not happy with sildenafil ---can have a headache after   Musculoskeletal:  Negative for arthralgias and joint swelling.       Some back pain--orthotics ordered by podiatrist  Skin:        Psoriasis controlled on the skyrizi  Allergic/Immunologic: Positive for environmental allergies. Negative for immunocompromised state.       No meds  Neurological:  Negative for dizziness, syncope, light-headedness and headaches.  Hematological:  Negative for adenopathy. Does not bruise/bleed easily.  Psychiatric/Behavioral:  Negative for decreased concentration. The patient is not nervous/anxious.        Not a great sleeper---not enough time in bed at times Uses the adderall occasionally       Objective:   Physical Exam Constitutional:      Appearance: Normal appearance.  HENT:     Mouth/Throat:     Pharynx:  No oropharyngeal exudate or posterior oropharyngeal erythema.  Eyes:     Conjunctiva/sclera: Conjunctivae normal.     Pupils: Pupils are equal, round, and reactive to light.  Cardiovascular:     Rate and Rhythm: Normal rate. Rhythm irregular.     Pulses: Normal pulses.     Heart sounds: No murmur heard.    No gallop.  Pulmonary:     Effort: Pulmonary effort is normal.     Breath sounds: Normal breath sounds. No wheezing or rales.  Abdominal:     Palpations: Abdomen is soft.     Tenderness: There is no abdominal tenderness.  Musculoskeletal:     Cervical back: Neck supple.     Right lower leg: No edema.     Left lower leg: No edema.  Lymphadenopathy:     Cervical: No cervical adenopathy.  Skin:    Findings: No lesion or rash.  Neurological:     General: No focal deficit present.     Mental Status: He is alert and oriented to person, place, and time.  Psychiatric:        Mood and Affect: Mood normal.        Behavior: Behavior normal.            Assessment & Plan:

## 2024-04-04 ENCOUNTER — Ambulatory Visit: Payer: Self-pay | Admitting: Internal Medicine

## 2024-04-04 LAB — HIV ANTIBODY (ROUTINE TESTING W REFLEX): HIV 1&2 Ab, 4th Generation: NONREACTIVE

## 2024-04-04 LAB — HEPATITIS C ANTIBODY: Hepatitis C Ab: NONREACTIVE

## 2024-04-05 ENCOUNTER — Ambulatory Visit: Admitting: Podiatry

## 2024-04-12 ENCOUNTER — Encounter: Payer: Self-pay | Admitting: Podiatry

## 2024-04-12 ENCOUNTER — Ambulatory Visit: Admitting: Podiatry

## 2024-04-12 DIAGNOSIS — M7742 Metatarsalgia, left foot: Secondary | ICD-10-CM

## 2024-04-12 DIAGNOSIS — M7741 Metatarsalgia, right foot: Secondary | ICD-10-CM | POA: Diagnosis not present

## 2024-04-12 NOTE — Patient Instructions (Signed)

## 2024-04-16 ENCOUNTER — Encounter: Payer: Self-pay | Admitting: Podiatry

## 2024-04-16 NOTE — Progress Notes (Signed)
  Subjective:  Patient ID: Jose Lee, male    DOB: [birth year],  MRN: [id number]  Chief Complaint  Patient presents with   Plantar Fasciitis    They're the same.    48 y.o. male presents with the above complaint. History confirmed with patient.  He returns for follow-up.  Not much difference.  Objective:  Physical Exam: warm, good capillary refill, no trophic changes or ulcerative lesions, normal DP and PT pulses, normal sensory exam, and today not acutely painful in the heel mild tenderness in the forefoot bilateral   Radiographs: Multiple views x-ray of both feet: Previous x-rays 01/03/2024 showed no fracture or stress fracture degenerative changes or heel spur Assessment:   1. Metatarsalgia of both feet      Plan:  Patient was evaluated and treated and all questions answered.  Hopeful that orthotics will offer him increased relief.  They were assessed for form fit and function today.  Orthotics were dispensed we reviewed the break-in process and warranty.Aaron Aas  He will let me know if there are any problems with them and follow-up as needed for this  No follow-ups on file.

## 2024-04-27 ENCOUNTER — Ambulatory Visit: Attending: Cardiology | Admitting: Cardiology

## 2024-04-27 ENCOUNTER — Encounter: Payer: Self-pay | Admitting: Cardiology

## 2024-04-27 VITALS — BP 104/70 | HR 95 | Ht 73.0 in | Wt 224.1 lb

## 2024-04-27 DIAGNOSIS — I499 Cardiac arrhythmia, unspecified: Secondary | ICD-10-CM

## 2024-04-27 DIAGNOSIS — I4819 Other persistent atrial fibrillation: Secondary | ICD-10-CM

## 2024-04-27 NOTE — Patient Instructions (Signed)
 Medication Instructions:  Your physician recommends that you continue on your current medications as directed. Please refer to the Current Medication list given to you today.   *If you need a refill on your cardiac medications before your next appointment, please call your pharmacy*  Lab Work: No labs ordered today  If you have labs (blood work) drawn today and your tests are completely normal, you will receive your results only by: MyChart Message (if you have MyChart) OR A paper copy in the mail If you have any lab test that is abnormal or we need to change your treatment, we will call you to review the results.  Testing/Procedures: Your physician has requested that you have an echocardiogram. Echocardiography is a painless test that uses sound waves to create images of your heart. It provides your doctor with information about the size and shape of your heart and how well your heart's chambers and valves are working.   You may receive an ultrasound enhancing agent through an IV if needed to better visualize your heart during the echo. This procedure takes approximately one hour.  There are no restrictions for this procedure.  This will take place at [address] [facility] [address] [facility]) [address], [place] [address]  Please note: We ask at that you not bring children with you during ultrasound (echo/ vascular) testing. Due to room size and safety concerns, children are not allowed in the ultrasound rooms during exams. Our front office staff cannot provide observation of children in our lobby area while testing is being conducted. An adult accompanying a patient to their appointment will only be allowed in the ultrasound room at the discretion of the ultrasound technician under special circumstances. We apologize for any inconvenience.   Follow-Up: At [facility], you and your health needs are our priority.  As part of our continuing mission to provide you with exceptional heart  care, our providers are all part of one team.  This team includes your primary Cardiologist (physician) and Advanced Practice Providers or APPs (Physician Assistants and Nurse Practitioners) who all work together to provide you with the care you need, when you need it.  Your next appointment:   2 month(s)  Provider:   You may see Dr. Junnie Olives or one of the following Advanced Practice Providers on your designated Care Team:   Laneta Pintos, NP Gildardo Labrador, PA-C Varney Gentleman, PA-C Cadence Ettrick, PA-C Ronald Cockayne, NP Morey Ar, NP    We recommend signing up for the patient portal called "MyChart".  Sign up information is provided on this After Visit Summary.  MyChart is used to connect with patients for Virtual Visits (Telemedicine).  Patients are able to view lab/test results, encounter notes, upcoming appointments, etc.  Non-urgent messages can be sent to your provider as well.   To learn more about what you can do with MyChart, go to [email or web address].

## 2024-04-27 NOTE — Progress Notes (Signed)
 Cardiology Office Note:    Date:  04/27/2024   ID:  Jose Lee, DOB May 31, 1976, MRN [id number]  PCP:  Jimmy Charlie FERNS, MD   [facility] Providers Cardiologist:  None     Referring MD: Jimmy Charlie FERNS, MD   Chief Complaint  Patient presents with   New Patient (Initial Visit)    Ref by Dr. Jimmy for new A-Fib. Patient c/o shortness of breath with over exertion.     History of Present Illness:    Jose Lee is a 48 y.o. male with a hx of atrial fibrillation, ADHD who presents due to A-fib.  Saw her primary care physician earlier this month for scheduled visit.  Noted to have irregular rhythm on exam.  EKG showed atrial fibrillation.  He denies palpitations, dizziness, syncope.  Denies any personal history of heart disease.  Was started on aspirin after diagnosis of atrial fibrillation.  Has no limitations in his activities of daily living.  Past Medical History:  Diagnosis Date   ADHD    Psoriasis     Past Surgical History:  Procedure Laterality Date   right hand surgery     2024   TESTICLE TORSION REDUCTION  age 15    Current Medications: Current Meds  Medication Sig   amphetamine -dextroamphetamine  (ADDERALL) 10 MG tablet Take 1 tablet (10 mg total) by mouth 2 (two) times daily with a meal.   aspirin EC 81 MG tablet Take 81 mg by mouth daily. Swallow whole.   Clobetasol Propionate Emulsion 0.05 % topical foam Apply topically 2 (two) times daily as needed.   meloxicam  (MOBIC ) 15 MG tablet TAKE 1 TABLET (15 MG TOTAL) BY MOUTH DAILY.   SKYRIZI PEN 150 MG/ML SOAJ every 3 (three) months.    tadalafil  (CIALIS ) 20 MG tablet Take 0.5-1 tablets (10-20 mg total) by mouth every other day as needed for erectile dysfunction.     Allergies:   Patient has no known allergies.   Social History   Socioeconomic History   Marital status: Married    Spouse name: Not on file   Number of children: 2   Years of education: Not on file   Highest education  level: Not on file  Occupational History   Occupation: Radio producer    Comment: [employer]  Tobacco Use   Smoking status: Never    Passive exposure: Never   Smokeless tobacco: Never  Vaping Use   Vaping status: Never Used  Substance and Sexual Activity   Alcohol use: Not Currently    Comment: occasional   Drug use: Never   Sexual activity: Not on file  Other Topics Concern   Not on file  Social History Narrative   Not on file   Social Drivers of Health   Financial Resource Strain: Not on file  Food Insecurity: Not on file  Transportation Needs: Not on file  Physical Activity: Not on file  Stress: Not on file  Social Connections: Not on file     Family History: The patient's family history includes Diabetes in his mother; Heart disease in his father; Hypertension in his father; Obesity in his brother. There is no history of Cancer, Colon cancer, Colon polyps, Esophageal cancer, Rectal cancer, or Stomach cancer.  ROS:   Please see the history of present illness.     All other systems reviewed and are negative.  EKGs/Labs/Other Studies Reviewed:    The following studies were reviewed today:  EKG Interpretation Date/Time:  Thursday April 27 2024 10:37:05 EDT Ventricular Rate:  95 PR Interval:    QRS Duration:  94 QT Interval:  332 QTC Calculation: 417 R Axis:   -32  Text Interpretation: Atrial fibrillation Left axis deviation Confirmed by Darliss Rogue ([id number]) on 04/27/2024 11:02:04 AM    Recent Labs: 04/03/2024: ALT 16; BUN 15; Creatinine, Ser 1.27; Hemoglobin 15.7; Platelets 292.0; Potassium 4.4; Sodium 139; TSH 1.46  Recent Lipid Panel    Component Value Date/Time   CHOL 155 04/03/2024 1248   TRIG 101.0 04/03/2024 1248   HDL 47.70 04/03/2024 1248   CHOLHDL 3 04/03/2024 1248   VLDL 20.2 04/03/2024 1248   LDLCALC 87 04/03/2024 1248     Risk Assessment/Calculations:              Physical Exam:    VS:  BP 104/70 (BP Location: Right Arm, Patient  Position: Sitting, Cuff Size: Normal)   Pulse 95   Ht 6' 1 (1.854 m)   Wt 224 lb 2 oz (101.7 kg)   SpO2 98%   BMI 29.57 kg/m     Wt Readings from Last 3 Encounters:  04/27/24 224 lb 2 oz (101.7 kg)  04/03/24 220 lb (99.8 kg)  07/30/23 215 lb (97.5 kg)     GEN:  Well nourished, well developed in no acute distress HEENT: Normal NECK: No JVD; No carotid bruits CARDIAC: RRR, no murmurs, rubs, gallops RESPIRATORY:  Clear to auscultation without rales, wheezing or rhonchi  ABDOMEN: Soft, non-tender, non-distended MUSCULOSKELETAL:  No edema; No deformity  SKIN: Warm and dry NEUROLOGIC:  Alert and oriented x 3 PSYCHIATRIC:  Normal affect   ASSESSMENT:    1. Persistent atrial fibrillation (HCC)   2. Irregular heart beat    PLAN:    In order of problems listed above:  Persistent atrial fibrillation, CHA2DS2-VASc score 0.  Heart rate controlled.  Obtain echo.  Start aspirin 81 mg daily, refer to EP.  Patient may be interested in restoring sinus rhythm, will like to get evaluated with echo prior.  Will plan for NOAC prior to DC cardioversion.  He is otherwise asymptomatic.  Follow-up after echo       Medication Adjustments/Labs and Tests Ordered: Current medicines are reviewed at length with the patient today.  Concerns regarding medicines are outlined above.  Orders Placed This Encounter  Procedures   Ambulatory referral to Cardiac Electrophysiology   EKG 12-Lead   ECHOCARDIOGRAM COMPLETE   No orders of the defined types were placed in this encounter.   Patient Instructions  Medication Instructions:  Your physician recommends that you continue on your current medications as directed. Please refer to the Current Medication list given to you today.   *If you need a refill on your cardiac medications before your next appointment, please call your pharmacy*  Lab Work: No labs ordered today  If you have labs (blood work) drawn today and your tests are completely normal,  you will receive your results only by: MyChart Message (if you have MyChart) OR A paper copy in the mail If you have any lab test that is abnormal or we need to change your treatment, we will call you to review the results.  Testing/Procedures: Your physician has requested that you have an echocardiogram. Echocardiography is a painless test that uses sound waves to create images of your heart. It provides your doctor with information about the size and shape of your heart and how well your heart's chambers and valves are working.   You may receive  an ultrasound enhancing agent through an IV if needed to better visualize your heart during the echo. This procedure takes approximately one hour.  There are no restrictions for this procedure.  This will take place at [address] [facility] [address] [facility]) [address], [place] [address]  Please note: We ask at that you not bring children with you during ultrasound (echo/ vascular) testing. Due to room size and safety concerns, children are not allowed in the ultrasound rooms during exams. Our front office staff cannot provide observation of children in our lobby area while testing is being conducted. An adult accompanying a patient to their appointment will only be allowed in the ultrasound room at the discretion of the ultrasound technician under special circumstances. We apologize for any inconvenience.   Follow-Up: At [facility], you and your health needs are our priority.  As part of our continuing mission to provide you with exceptional heart care, our providers are all part of one team.  This team includes your primary Cardiologist (physician) and Advanced Practice Providers or APPs (Physician Assistants and Nurse Practitioners) who all work together to provide you with the care you need, when you need it.  Your next appointment:   2 month(s)  Provider:   You may see Dr. Darliss or one of the following Advanced Practice Providers  on your designated Care Team:   Lonni Meager, NP Lesley Maffucci, PA-C Bernardino Bring, PA-C Cadence [place], PA-C Tylene Lunch, NP Barnie Hila, NP    We recommend signing up for the patient portal called MyChart.  Sign up information is provided on this After Visit Summary.  MyChart is used to connect with patients for Virtual Visits (Telemedicine).  Patients are able to view lab/test results, encounter notes, upcoming appointments, etc.  Non-urgent messages can be sent to your provider as well.   To learn more about what you can do with MyChart, go to [email or web address].          Signed, Redell Darliss, MD  04/27/2024 12:21 PM    [facility]

## 2024-05-11 ENCOUNTER — Ambulatory Visit: Attending: Cardiology

## 2024-05-11 DIAGNOSIS — I4819 Other persistent atrial fibrillation: Secondary | ICD-10-CM | POA: Diagnosis not present

## 2024-05-11 LAB — ECHOCARDIOGRAM COMPLETE
AR max vel: 2.36 cm2
AV Area VTI: 2.1 cm2
AV Area mean vel: 2.25 cm2
AV Mean grad: 4 mmHg
AV Peak grad: 7.7 mmHg
Ao pk vel: 1.39 m/s
Area-P 1/2: 3.91 cm2
Calc EF: 63.4 %
S' Lateral: 3.6 cm
Single Plane A2C EF: 61.1 %
Single Plane A4C EF: 65.1 %

## 2024-05-12 ENCOUNTER — Ambulatory Visit: Payer: Self-pay | Admitting: Cardiology

## 2024-05-17 ENCOUNTER — Ambulatory Visit (INDEPENDENT_AMBULATORY_CARE_PROVIDER_SITE_OTHER)

## 2024-05-17 ENCOUNTER — Ambulatory Visit
Admission: EM | Admit: 2024-05-17 | Discharge: 2024-05-17 | Disposition: A | Discharge status: Home / Self Care | Source: Ambulatory Visit | Attending: Family Medicine | Admitting: Family Medicine

## 2024-05-17 DIAGNOSIS — M5417 Radiculopathy, lumbosacral region: Secondary | ICD-10-CM | POA: Diagnosis not present

## 2024-05-17 DIAGNOSIS — M519 Unspecified thoracic, thoracolumbar and lumbosacral intervertebral disc disorder: Secondary | ICD-10-CM | POA: Diagnosis not present

## 2024-05-17 MED ORDER — KETOROLAC TROMETHAMINE 60 MG/2ML IM SOLN
30.0000 mg | Freq: Once | INTRAMUSCULAR | Status: AC
  Administered 2024-05-17: 30 mg via INTRAMUSCULAR

## 2024-05-17 MED ORDER — PREDNISONE 10 MG (21) PO TBPK: ORAL_TABLET | Freq: Every day | ORAL | 0 refills | Status: DC

## 2024-05-17 MED ORDER — METHOCARBAMOL 500 MG PO TABS: 500.0000 mg | ORAL_TABLET | Freq: Two times a day (BID) | ORAL | 0 refills | Status: DC | PRN

## 2024-05-17 NOTE — ED Provider Notes (Signed)
 [facility]    CSN: [id number] Arrival date & time: 05/17/24  1609      History   Chief Complaint Chief Complaint  Patient presents with   Back Pain    HPI  HPI Jose Lee is a 48 y.o. male.   Jose Lee presents for low back pain and buttock pain that started yesterday after cleaning up his garage.  Has pain radiating down his left leg. Has numbness and tingling down to his toes.  Took Naprosyn last night that helped somewhat but didn't help this morning. He was doing a lot of bending but denies heavy lifting while cleaning the garage.   Pain was very unbearable today and has difficulty walking without significant pain. The nurse gave him some ice but that didn't help. Pain better somewhat with sitting but not able to walk. Pain rated 10/10 and described as sharp with movement and sitting is achy pain.    Had similar pain about 6 months ago that went away after 3-4 days.    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 Authorizing Provider  methocarbamol  (ROBAXIN ) 500 MG tablet Take 1 tablet (500 mg total) by mouth 2 (two) times daily as needed for muscle spasms. 05/17/24  Yes Winthrop Shannahan, DO  predniSONE  (STERAPRED UNI-PAK  21 TAB) 10 MG (21) TBPK tablet Take by mouth daily. Take 6 tabs by mouth daily for 1, then 5 tabs for 1 day, then 4 tabs for 1 day, then 3 tabs for 1 day, then 2 tabs for 1 day, then 1 tab for 1 day. 05/17/24  Yes Rhyli Depaula, DO  amphetamine -dextroamphetamine  (ADDERALL) 10 MG tablet Take 1 tablet (10 mg total) by mouth 2 (two) times daily with a meal. 04/03/24   Jimmy Charlie FERNS, MD  aspirin EC 81 MG tablet Take 81 mg by mouth daily. Swallow whole.    [provider]  Clobetasol Propionate Emulsion 0.05 % topical foam Apply topically 2 (two) times daily as needed. 04/29/20   [provider]  diazepam  (VALIUM ) 2 MG tablet Take 1 tablet (2 mg total) by mouth at bedtime as needed for muscle spasms. 05/20/24 05/20/25  Malvina Alm DASEN, MD  lidocaine  (LIDODERM ) 5 % Place 1 patch onto the skin daily for 10 days. Remove & Discard patch within 12 hours or as directed by MD 05/20/24 05/30/24  Malvina Alm DASEN, MD  meloxicam  (MOBIC ) 15 MG tablet TAKE 1 TABLET (15 MG TOTAL) BY MOUTH DAILY. 02/02/24   McDonald, Juliene SAUNDERS, DPM  oxyCODONE  (ROXICODONE ) 5 MG immediate release tablet Take 1 tablet (5 mg total) by mouth every 8 (eight) hours as needed.  05/20/24 05/20/25  Malvina Alm DASEN, MD  SKYRIZI PEN 150 MG/ML SOAJ every 3 (three) months.  08/20/20   [provider]  tadalafil  (CIALIS ) 20 MG tablet Take 0.5-1 tablets (10-20 mg total) by mouth every other day as needed for erectile dysfunction. 04/03/24   Jimmy Charlie FERNS, MD    Family History Family History  Problem Relation Age of Onset   Diabetes Mother    Hypertension Father    Heart disease Father    Obesity Brother    Cancer Neg Hx    Colon cancer Neg Hx    Colon polyps Neg Hx    Esophageal cancer Neg Hx    Rectal cancer Neg Hx    Stomach cancer Neg Hx     Social History Social History   Tobacco Use   Smoking status: Never    Passive exposure: Never   Smokeless tobacco: Never  Vaping Use   Vaping status: Never Used  Substance  Use Topics   Alcohol use: Not Currently    Comment: occasional   Drug use: Never     Allergies   Patient has no known allergies.   Review of Systems Review of Systems: egative unless otherwise stated in HPI.      Physical Exam Triage Vital Signs ED Triage Vitals  Encounter Vitals Group     BP 05/17/24 1658 109/78     Girls Systolic BP Percentile --      Girls Diastolic BP Percentile --      Boys Systolic BP Percentile --      Boys Diastolic BP Percentile --      Pulse Rate 05/17/24 1658 86     Resp 05/17/24 1658 16     Temp 05/17/24 1658 98.7 F (37.1 C)     Temp Source 05/17/24 1658 Oral     SpO2 05/17/24 1658 97 %     Weight 05/17/24 1658 224 lb 2 oz (101.7 kg)     Height 05/17/24 1658 6' 1 (1.854 m)     Head Circumference --      Peak Flow --      Pain Score 05/17/24 1700 10     Pain Loc --      Pain Education --      Exclude from Growth Chart --    No data found.  Updated Vital Signs BP 109/78 (BP Location: Right Arm)   Pulse 86   Temp 98.7 F (37.1 C) (Oral)   Resp 16   Ht 6' 1 (1.854 m)   Wt 101.7 kg   SpO2 97%   BMI 29.57 kg/m   Visual Acuity Right Eye Distance:   Left Eye Distance:   Bilateral Distance:    Right Eye Near:   Left Eye Near:    Bilateral Near:     Physical Exam GEN: well appearing male in no acute distress  CVS: well perfused  RESP: speaking in full sentences without pause, no respiratory distress  MSK:  Lumbar spine: - Inspection: no gross deformity or asymmetry, swelling or ecchymosis. No skin changes  - Palpation: +TTP over the midline spinous processes, bilateral lumbar paraspinal muscles, no SI joint tenderness bilaterally - ROM: full active ROM of the lumbar spine in flexion and extension but with mild pain with flexion/extension  - Strength: 5/5 strength of lower extremity in L4-S1 nerve root distributions b/l - Neuro: sensation intact in the L4-S1 nerve root distribution b/l, 2+ L4 and S1 reflexes - Special  testing: Bilateral  straight leg raise worse on the left  SKIN: warm, dry, no overly skin rash or erythema    UC Treatments / Results  Labs (all labs ordered are listed, but only abnormal results are displayed) Labs Reviewed - No data to display  EKG   Radiology DG Lumbar Spine Complete Result Date: 05/17/2024 CLINICAL DATA:  Low back pain. EXAM: LUMBAR SPINE - COMPLETE 4+ VIEW COMPARISON:  None Available. FINDINGS: Very minimal retrolisthesis of L5 on S1. Alignment is otherwise anatomic. Vertebral body and disc space heights are maintained. No pars defects. Mild facet sclerosis in the lower lumbar spine. IMPRESSION: 1. No acute findings. 2. Minimal retrolisthesis of L5 on S1. 3. Facet sclerosis in the lower lumbar spine. Electronically Signed   By: Newell Eke M.D.   On: 05/17/2024 17:48     Procedures Procedures (including critical care time)  Medications Ordered in UC Medications  ketorolac  (TORADOL ) injection 30 mg (30 mg Intramuscular Given 05/17/24 1743)    Initial Impression / Assessment and Plan / UC Course  I have reviewed the triage vital signs and the nursing notes.  Pertinent labs & imaging results that were available during my care of the patient were reviewed by me and considered in my medical decision making (see chart for details).      Pt is a 48 y.o.  male with 1 day of  back pain after cleaning out the garage.  Discussed IM vs po pain control and pt agreeable to IM.  Toradol  30 mg IM given.  Has history of low back pain.   Obtained lumbar plain films.  Xray personally interpreted by me were unremarkable for fracture, or significant malalignment.  Radiologist report reviewed and notes minimal retrolisthesis of L5 on S1.   Patient to gradually return to normal activities, as tolerated and continue ordinary activities within the limits permitted by pain. Prescribed prednisone  taper  and muscle relaxer  for pain relief.  Advised patient to avoid NSAIDs while  taking prednisone  to prevent ulcers. Tylenol  and Lidocaine  patches PRN for multimodal pain relief. Counseled patient on red flag symptoms and when to seek immediate care.  No red flags suggesting cauda equina syndrome or progressive major motor weakness. Patient to follow up with orthopedic provider if symptoms do not improve with conservative treatment.  Return and ED precautions given.    Discussed MDM, treatment plan and plan for follow-up with patient who agrees with plan.   Final Clinical Impressions(s) / UC Diagnoses   Final diagnoses:  Lumbosacral radiculitis     Discharge Instructions      If medication was prescribed, stop by the pharmacy to pick up your prescriptions.  For your  pain, Take 1000 mg Tylenol  twice a day, take muscle relaxer (Flexril/cyclobenzaprine) twice a day,   as needed for pain. Rest and elevate the affected painful area.  Apply cold compresses intermittently, as needed.  As pain recedes, begin normal activities slowly as tolerated.  Follow up with primary care provider or an orthopedic provider, if symptoms persist.  Watch for worsening symptoms such as an increasing weakness or loss of sensation, increasing pain and/or the loss of bladder or bowel function. Should any of these occur, go to the emergency department immediately.         ED Prescriptions     Medication Sig Dispense Auth. Provider   predniSONE  (STERAPRED UNI-PAK 21 TAB) 10 MG (21) TBPK tablet Take by mouth daily. Take 6 tabs by mouth daily for 1, then 5 tabs  for 1 day, then 4 tabs for 1 day, then 3 tabs for 1 day, then 2 tabs for 1 day, then 1 tab for 1 day. 21 tablet Cem Kosman, DO   methocarbamol  (ROBAXIN ) 500 MG tablet Take 1 tablet (500 mg total) by mouth 2 (two) times daily as needed for muscle spasms. 20 tablet Orlanda Lemmerman, DO      PDMP not reviewed this encounter.   Kriste Berth, DO 05/24/24 1512

## 2024-05-17 NOTE — ED Triage Notes (Signed)
 Pt c/o back pain x1 day. Denies any falls,injuries or heavy lifting. Has tried OTC meds w/o relief.

## 2024-05-17 NOTE — Discharge Instructions (Addendum)
 If medication was prescribed, stop by the pharmacy to pick up your prescriptions.  For your  pain, Take 1000 mg Tylenol twice a day, take muscle relaxer (Flexril/cyclobenzaprine) twice a day,   as needed for pain. Rest and elevate the affected painful area.  Apply cold compresses intermittently, as needed.  As pain recedes, begin normal activities slowly as tolerated.  Follow up with primary care provider or an orthopedic provider, if symptoms persist.  Watch for worsening symptoms such as an increasing weakness or loss of sensation, increasing pain and/or the loss of bladder or bowel function. Should any of these occur, go to the emergency department immediately.

## 2024-05-19 ENCOUNTER — Encounter: Payer: Self-pay | Admitting: Emergency Medicine

## 2024-05-19 ENCOUNTER — Emergency Department
Admission: EM | Admit: 2024-05-19 | Discharge: 2024-05-20 | Disposition: A | Discharge status: Home / Self Care | Attending: Emergency Medicine | Admitting: Emergency Medicine

## 2024-05-19 ENCOUNTER — Other Ambulatory Visit: Payer: Self-pay

## 2024-05-19 DIAGNOSIS — M545 Low back pain, unspecified: Secondary | ICD-10-CM | POA: Diagnosis present

## 2024-05-19 DIAGNOSIS — M5442 Lumbago with sciatica, left side: Secondary | ICD-10-CM | POA: Diagnosis not present

## 2024-05-19 DIAGNOSIS — M5432 Sciatica, left side: Secondary | ICD-10-CM

## 2024-05-19 NOTE — ED Notes (Signed)
 Pt presented to ED with c/o left lower back pain radiating down left leg that started on Tuesday after doing yard work. States when he woke up Wednesday pain was 10/10. Seen at [facility] on 7/16 and prescribed muscle relaxer and steroid. States medications have not helped pain. Pain 10/10, worse when walking. States took tylenol arthritis without relief of symptoms. Endorses mild nausea. A&ox4.

## 2024-05-19 NOTE — ED Triage Notes (Signed)
 Pt reports left lower back pain that radiates down left leg, worse w/ ambulation. Pt seen at [facility] on 05/17/24 and discharged w/ prednisone  and muscle relaxer. Medications not helping. Pt state pain is worse. Denies falls, injury, or heavy lifting.

## 2024-05-19 NOTE — ED Notes (Signed)
 Wells MD at bedside.

## 2024-05-20 MED ORDER — LIDOCAINE 5 % EX PTCH
1.0000 | MEDICATED_PATCH | Freq: Once | CUTANEOUS | Status: DC
  Administered 2024-05-20: 1 via TRANSDERMAL
  Filled 2024-05-20: qty 1

## 2024-05-20 MED ORDER — DIAZEPAM 2 MG PO TABS: 2.0000 mg | ORAL_TABLET | Freq: Every evening | ORAL | 0 refills | Status: DC | PRN

## 2024-05-20 MED ORDER — KETOROLAC TROMETHAMINE 60 MG/2ML IM SOLN
60.0000 mg | Freq: Once | INTRAMUSCULAR | Status: AC
  Administered 2024-05-20: 60 mg via INTRAMUSCULAR
  Filled 2024-05-20: qty 2

## 2024-05-20 MED ORDER — LIDOCAINE 5 % EX PTCH: 1.0000 | MEDICATED_PATCH | CUTANEOUS | 0 refills | Status: AC

## 2024-05-20 MED ORDER — OXYCODONE HCL 5 MG PO TABS
5.0000 mg | ORAL_TABLET | Freq: Three times a day (TID) | ORAL | 0 refills | Status: DC | PRN
Start: 2024-05-20 — End: 2024-05-30

## 2024-05-20 MED ORDER — ACETAMINOPHEN 500 MG PO TABS
1000.0000 mg | ORAL_TABLET | Freq: Once | ORAL | Status: AC
  Administered 2024-05-20: 1000 mg via ORAL
  Filled 2024-05-20: qty 2

## 2024-05-20 NOTE — Discharge Instructions (Addendum)
 Your symptoms are most consistent with sciatica which is a pinched nerve in your lower back causing your symptoms.  I would like you to take ibuprofen 600 mg every 8 hours, Tylenol  1000 mg every 6 hours scheduled for the next 3 to 5 days.  You can continue taking the steroids and muscle relaxers a sent home with you.  I have also sent topical numbing patches to place in your left lower back.  Please follow-up with an orthopedic physician as needed for any ongoing symptoms.  Please do not drive on any of the stronger medication that I have sent home with you as it can make you sleepy.  Do not take the oxycodone  and Valium  together at the same time.  You can take the Valium  at night before going to bed to help with muscle spasms.

## 2024-05-20 NOTE — ED Provider Notes (Signed)
 [facility] Provider Note    Event Date/Time   First MD Initiated Contact with Patient 05/19/24 2324     (approximate)   History   Back Pain   HPI Jose Lee is a 48 y.o. male presenting today for left low back pain.  Patient states several days ago he had started noticing pain in his left lower back which radiated down his leg.  He went to urgent care 3 days ago where they diagnosed him with arthritis and had x-rays of his lumbar spine.  Has been on prednisone  and Robaxin  without significant improvement.  No obvious numbness or weakness and only secondary to pain symptoms.  Denies any trauma to his back.  No fevers.  No abdominal pain, constipation, difficulty urinating.     Physical Exam   Triage Vital Signs: ED Triage Vitals [05/19/24 2108]  Encounter Vitals Group     BP (!) 119/91     Girls Systolic BP Percentile      Girls Diastolic BP Percentile      Boys Systolic BP Percentile      Boys Diastolic BP Percentile      Pulse Rate 87     Resp 16     Temp 98 F (36.7 C)     Temp Source Oral     SpO2 98 %     Weight      Height      Head Circumference      Peak Flow      Pain Score      Pain Loc      Pain Education      Exclude from Growth Chart     Most recent vital signs: Vitals:   05/19/24 2108 05/19/24 2114  BP: (!) 119/91   Pulse: 87   Resp: 16   Temp: 98 F (36.7 C)   SpO2: 98% 98%   I have reviewed the vital signs. General:  Awake, alert, no acute distress. Head:  Normocephalic, Atraumatic. EENT:  PERRL, EOMI, Oral mucosa pink and moist, Neck is supple. Cardiovascular: Regular rate, 2+ distal pulses. Respiratory:  Normal respiratory effort, symmetrical expansion, no distress.   Extremities:  Moving all four extremities through full ROM without pain.  No L-spine tenderness palpation.  Positive straight leg raise test on the left side Neuro:  Alert and oriented.  Interacting appropriately.   Skin:  Warm, dry, no rash.    Psych: Appropriate affect.    ED Results / Procedures / Treatments   Labs (all labs ordered are listed, but only abnormal results are displayed) Labs Reviewed - No data to display   EKG    RADIOLOGY    PROCEDURES:  Critical Care performed: No  Procedures   MEDICATIONS ORDERED IN ED: Medications  ketorolac  (TORADOL ) injection 60 mg (has no administration in time range)  acetaminophen  (TYLENOL ) tablet 1,000 mg (has no administration in time range)  lidocaine  (LIDODERM ) 5 % 1 patch (has no administration in time range)     IMPRESSION / MDM / ASSESSMENT AND PLAN / ED COURSE  I reviewed the triage vital signs and the nursing notes.                              Differential diagnosis includes, but is not limited to, sciatica, muscle spasm  Patient's presentation is most consistent with acute, uncomplicated illness.  Patient is a 48 year old male presenting today for left  low back pain radiating down his leg.  His symptoms and exam are most consistent with sciatica with poor pain control.  Instructed him on use of scheduled Tylenol  and ibuprofen for the next 3 to 5 days.  Continued use of prednisone  and Robaxin  at home.  Sent him home with a couple of pills of oxycodone  for stronger pain medicine as well as as needed nightly Valium  to help with sleep given pain symptoms.  Otherwise told to follow-up with orthopedics for ongoing symptoms.  Given strict return precautions.     FINAL CLINICAL IMPRESSION(S) / ED DIAGNOSES   Final diagnoses:  Sciatica of left side     Rx / DC Orders   ED Discharge Orders          Ordered    diazepam  (VALIUM ) 2 MG tablet  At bedtime PRN        05/20/24 0010    oxyCODONE  (ROXICODONE ) 5 MG immediate release tablet  Every 8 hours PRN        05/20/24 0010    lidocaine  (LIDODERM ) 5 %  Every 24 hours        05/20/24 0010             Note:  This document was prepared using Dragon voice recognition software and may include  unintentional dictation errors.   Malvina Alm DASEN, MD 05/20/24 [phone]

## 2024-05-30 ENCOUNTER — Ambulatory Visit: Admitting: Internal Medicine

## 2024-05-30 ENCOUNTER — Encounter: Payer: Self-pay | Admitting: Internal Medicine

## 2024-05-30 VITALS — BP 110/84 | HR 71 | Temp 98.4°F | Ht 73.0 in | Wt 216.0 lb

## 2024-05-30 DIAGNOSIS — M5432 Sciatica, left side: Secondary | ICD-10-CM | POA: Insufficient documentation

## 2024-05-30 MED ORDER — TIZANIDINE HCL 2 MG PO TABS: 2.0000 mg | ORAL_TABLET | Freq: Four times a day (QID) | ORAL | 1 refills | Status: AC | PRN

## 2024-05-30 NOTE — Progress Notes (Signed)
 Subjective:    Patient ID: Jose Lee, male    DOB: May 09, 1976, 48 y.o.   MRN: [id number]  HPI Here due to persistent back pain  Was cleaning garage 7/15 Didn't notice any injury but woke with left low back pain It then markedly worsened so went to urgent care that evening X-ray unremarkable Got IM toradol  Got prednisone  and muscle relaxer --nothing was helping  Then to ER 7/18 No different Rx--but got 2 injections without relief Oxycodone  --even 2 at a time--didn't help  Lidocaine  patches have been helping  Took ibuprofen 600 and naproxen Rx left over---didn't help Tylenol  every 4 hours--also not helping  Current Outpatient Medications on File Prior to Visit  Medication Sig Dispense Refill   Clobetasol Propionate Emulsion 0.05 % topical foam Apply topically 2 (two) times daily as needed.     SKYRIZI PEN 150 MG/ML SOAJ every 3 (three) months.      tadalafil  (CIALIS ) 20 MG tablet Take 0.5-1 tablets (10-20 mg total) by mouth every other day as needed for erectile dysfunction. 10 tablet 11   amphetamine -dextroamphetamine  (ADDERALL) 10 MG tablet Take 1 tablet (10 mg total) by mouth 2 (two) times daily with a meal. (Patient not taking: Reported on 05/30/2024) 60 tablet 0   aspirin EC 81 MG tablet Take 81 mg by mouth daily. Swallow whole. (Patient not taking: Reported on 05/30/2024)     diazepam  (VALIUM ) 2 MG tablet Take 1 tablet (2 mg total) by mouth at bedtime as needed for muscle spasms. (Patient not taking: Reported on 05/30/2024) 5 tablet 0   lidocaine  (LIDODERM ) 5 % Place 1 patch onto the skin daily for 10 days. Remove & Discard patch within 12 hours or as directed by MD (Patient not taking: Reported on 05/30/2024) 10 patch 0   methocarbamol  (ROBAXIN ) 500 MG tablet Take 1 tablet (500 mg total) by mouth 2 (two) times daily as needed for muscle spasms. (Patient not taking: Reported on 05/30/2024) 20 tablet 0   oxyCODONE  (ROXICODONE ) 5 MG immediate release tablet Take 1 tablet (5 mg  total) by mouth every 8 (eight) hours as needed. (Patient not taking: Reported on 05/30/2024) 8 tablet 0   predniSONE  (STERAPRED UNI-PAK 21 TAB) 10 MG (21) TBPK tablet Take by mouth daily. Take 6 tabs by mouth daily for 1, then 5 tabs for 1 day, then 4 tabs for 1 day, then 3 tabs for 1 day, then 2 tabs for 1 day, then 1 tab for 1 day. 21 tablet 0   No current facility-administered medications on file prior to visit.    No Known Allergies  Past Medical History:  Diagnosis Date   ADHD    Psoriasis     Past Surgical History:  Procedure Laterality Date   right hand surgery     2024   TESTICLE TORSION REDUCTION  age 54    Family History  Problem Relation Age of Onset   Diabetes Mother    Hypertension Father    Heart disease Father    Obesity Brother    Cancer Neg Hx    Colon cancer Neg Hx    Colon polyps Neg Hx    Esophageal cancer Neg Hx    Rectal cancer Neg Hx    Stomach cancer Neg Hx     Social History   Socioeconomic History   Marital status: Married    Spouse name: Not on file   Number of children: 2   Years of education: Not on file  Highest education level: Not on file  Occupational History   Occupation: Radio producer    Comment: [employer]  Tobacco Use   Smoking status: Never    Passive exposure: Never   Smokeless tobacco: Never  Vaping Use   Vaping status: Never Used  Substance and Sexual Activity   Alcohol use: Not Currently    Comment: occasional   Drug use: Never   Sexual activity: Not on file  Other Topics Concern   Not on file  Social History Narrative   Not on file   Social Drivers of Health   Financial Resource Strain: Not on file  Food Insecurity: Not on file  Transportation Needs: Not on file  Physical Activity: Not on file  Stress: Not on file  Social Connections: Not on file  Intimate Partner Violence: Not on file   Review of Systems Went to chiropractor in [place] when he was there last week--this helped some Went locally  yesterday---going back later in the week Has had some thigh numbness from skyrizi injections--but now has numbness lower into leg as well (now gives it in abdomen) No leg weakness No sensory changes in groin     Objective:   Physical Exam Constitutional:      Appearance: Normal appearance.  Musculoskeletal:     Comments: No spine tenderness Mild left lumbar tenderness --paraspinals and lateral Flexion near complete Normal ROM in hips SLR negative on right, and positive on left with full flexion  Neurological:     Mental Status: He is alert.     Comments: Slightly antalgic gait No leg weakness            Assessment & Plan:

## 2024-05-30 NOTE — Assessment & Plan Note (Signed)
 Reassured--not disc related and no radiculopathy. No arthritis on the x-ray Continue the lidocaine  --night or day heat Discussed continuing the NSAIDs Tizanidine  2-4 mg at bedtime Chiropractic  If not improving in 1-2 weeks, would refer to physiatry

## 2024-06-09 ENCOUNTER — Ambulatory Visit: Admitting: Cardiology

## 2024-06-20 NOTE — Progress Notes (Unsigned)
  Electrophysiology Office Note:    Date:  06/21/2024   ID:  Jose Lee, DOB 09-Dec-1975, MRN [id number]  [facility] Cardiologist:  None  [facility] Electrophysiologist:  OLE ONEIDA HOLTS, MD   Referring MD: Jose Rogue, MD   Chief Complaint: Atrial fibrillation  History of Present Illness:    Mr. Jose Lee is a 48 year old man who I am seeing today for an evaluation of atrial fibrillation at the request of Dr. Darliss.  The patient has a history of ADHD along with his atrial fibrillation.  His atrial fibrillation was diagnosed by his primary care physician this summer.  He was started on aspirin and referred to EP.  He is with his wife today in clinic.  The patient and his wife report subtle symptoms that could be attributed to his atrial fibrillation.  He tells me that he may be more fatigued over the last few months.  He tells me he had a colonoscopy in October 2024.  No mention of atrial fibrillation at that time.  He does not feel palpitations.  No family history of atrial fibrillation.  He rarely uses Adderall.  He rarely drinks.  He does not use any caffeine supplements.      Their past medical, social and family history was reviewed.   ROS:   Please see the history of present illness.    All other systems reviewed and are negative.  EKGs/Labs/Other Studies Reviewed:    The following studies were reviewed today:  May 11, 2024 echo EF 50 to 55% RV mildly reduced Mild MR  April 27, 2024 ECG shows atrial fibrillation and a ventricular rate of 95 bpm. EKG Interpretation Date/Time:  Wednesday June 21 2024 15:44:00 EDT Ventricular Rate:  111 PR Interval:    QRS Duration:  90 QT Interval:  322 QTC Calculation: 437 R Axis:   -8  Text Interpretation: Atrial fibrillation Confirmed by HOLTS OLE [phone]) on 06/21/2024 3:51:30 PM    Physical Exam:    VS:  BP 105/70   Pulse (!) 111   Ht 6' 1 (1.854 m)   Wt 219 lb 12.8 oz (99.7 kg)    SpO2 98%   BMI 29.00 kg/m     Wt Readings from Last 3 Encounters:  06/21/24 219 lb 12.8 oz (99.7 kg)  05/30/24 216 lb (98 kg)  05/17/24 224 lb 2 oz (101.7 kg)     GEN: no distress CARD: Irregularly irregular, No MRG RESP: No IWOB. CTAB.        ASSESSMENT AND PLAN:    1. Persistent atrial fibrillation (HCC)     #Persistent atrial fibrillation Minimally symptomatic.  Normal left ventricular function.  Given relatively recent onset, I would like to give him a trial of sinus rhythm.  He should start Xarelto  once daily at dinner.  When he starts Xarelto , stop aspirin..  In 4 weeks, plan for cardioversion.  He will continue the anticoagulant for 4 weeks after cardioversion. I discussed cardioversion procedure in detail including the risk of stroke.  I stressed the importance of medication adherence leading up to the cardioversion procedure and after to minimize stroke risk.  Follow-up with APP in 10 weeks to review heart rhythm and treatment options for his atrial fibrillation.  He would be a good candidate for catheter ablation if he has recurrence of arrhythmia.     Signed, OLE ONEIDA. HOLTS, MD, [facility], [facility] 06/21/2024 4:16 PM    Electrophysiology [facility]

## 2024-06-21 ENCOUNTER — Encounter: Payer: Self-pay | Admitting: Cardiology

## 2024-06-21 ENCOUNTER — Ambulatory Visit: Attending: Cardiology | Admitting: Cardiology

## 2024-06-21 ENCOUNTER — Other Ambulatory Visit: Payer: Self-pay

## 2024-06-21 VITALS — BP 105/70 | HR 111 | Ht 73.0 in | Wt 219.8 lb

## 2024-06-21 DIAGNOSIS — I4819 Other persistent atrial fibrillation: Secondary | ICD-10-CM | POA: Diagnosis not present

## 2024-06-21 MED ORDER — RIVAROXABAN 20 MG PO TABS: 20.0000 mg | ORAL_TABLET | Freq: Every day | ORAL | 3 refills | Status: AC

## 2024-06-21 NOTE — Patient Instructions (Addendum)
 Medication Instructions:  Your physician has recommended you make the following change in your medication:  1) START taking Xarelto  20 mg once daily  2) STOP taking aspirin  *If you need a refill on your cardiac medications before your next appointment, please call your pharmacy*  Lab Work: BMET and CBC   Testing/Procedures: Cardioversion  Your physician has recommended that you have a Cardioversion (DCCV). Electrical Cardioversion uses a jolt of electricity to your heart either through paddles or wired patches attached to your chest. This is a controlled, usually prescheduled, procedure. Defibrillation is done under light anesthesia in the hospital, and you usually go home the day of the procedure. This is done to get your heart back into a normal rhythm. You are not awake for the procedure. Please see the instruction sheet given to you today.  Follow-Up: At [facility], you and your health needs are our priority.  As part of our continuing mission to provide you with exceptional heart care, our providers are all part of one team.  This team includes your primary Cardiologist (physician) and Advanced Practice Providers or APPs (Physician Assistants and Nurse Practitioners) who all work together to provide you with the care you need, when you need it.  Your next appointment:   8 weeks  Provider:   Suzann Riddle, NP

## 2024-06-22 ENCOUNTER — Ambulatory Visit: Payer: Self-pay | Admitting: *Deleted

## 2024-06-22 LAB — BASIC METABOLIC PANEL WITH GFR
BUN/Creatinine Ratio: 8 — ABNORMAL LOW (ref 9–20)
BUN: 12 mg/dL (ref 6–24)
CO2: 20 mmol/L (ref 20–29)
Calcium: 9.6 mg/dL (ref 8.7–10.2)
Chloride: 104 mmol/L (ref 96–106)
Creatinine, Ser: 1.47 mg/dL — ABNORMAL HIGH (ref 0.76–1.27)
Glucose: 76 mg/dL (ref 70–99)
Potassium: 4 mmol/L (ref 3.5–5.2)
Sodium: 139 mmol/L (ref 134–144)
eGFR: 58 mL/min/1.73 — ABNORMAL LOW (ref >59)

## 2024-06-22 LAB — CBC
Hematocrit: 42.6 % (ref 37.5–51.0)
Hemoglobin: 14.3 g/dL (ref 13.0–17.7)
MCH: 33.1 pg — ABNORMAL HIGH (ref 26.6–33.0)
MCHC: 33.6 g/dL (ref 31.5–35.7)
MCV: 99 fL — ABNORMAL HIGH (ref 79–97)
Platelets: 321 x10E3/uL (ref 150–450)
RBC: 4.32 x10E6/uL (ref 4.14–5.80)
RDW: 11.7 % (ref 11.6–15.4)
WBC: 7.4 x10E3/uL (ref 3.4–10.8)

## 2024-06-27 ENCOUNTER — Ambulatory Visit: Admitting: Cardiology

## 2024-07-14 ENCOUNTER — Telehealth: Payer: Self-pay | Admitting: Cardiology

## 2024-07-14 NOTE — Telephone Encounter (Signed)
 Called to say the patient surgery time for 9/19 has to be moved. They stated that have a meeting during that time. It can be on the same date but just at a later time. Please advise

## 2024-07-14 NOTE — Telephone Encounter (Signed)
 Called and spoke with patient. Patient procedure time changed to 12:30 pm. Patient verbalizes understanding.

## 2024-07-21 ENCOUNTER — Encounter
Admission: RE | Disposition: A | Discharge status: Home / Self Care | Payer: Self-pay | Source: Home / Self Care | Attending: Cardiovascular Disease

## 2024-07-21 ENCOUNTER — Ambulatory Visit: Admitting: Certified Registered Nurse Anesthetist

## 2024-07-21 ENCOUNTER — Ambulatory Visit
Admission: RE | Admit: 2024-07-21 | Discharge: 2024-07-21 | Disposition: A | Discharge status: Home / Self Care | Attending: Cardiovascular Disease | Admitting: Cardiovascular Disease

## 2024-07-21 ENCOUNTER — Other Ambulatory Visit: Payer: Self-pay

## 2024-07-21 DIAGNOSIS — Z7901 Long term (current) use of anticoagulants: Secondary | ICD-10-CM | POA: Insufficient documentation

## 2024-07-21 DIAGNOSIS — I4819 Other persistent atrial fibrillation: Secondary | ICD-10-CM | POA: Diagnosis present

## 2024-07-21 DIAGNOSIS — I4891 Unspecified atrial fibrillation: Secondary | ICD-10-CM | POA: Diagnosis not present

## 2024-07-21 DIAGNOSIS — R0602 Shortness of breath: Secondary | ICD-10-CM

## 2024-07-21 DIAGNOSIS — Z01818 Encounter for other preprocedural examination: Secondary | ICD-10-CM

## 2024-07-21 HISTORY — PX: CARDIOVERSION: SHX1299

## 2024-07-21 SURGERY — CARDIOVERSION
Anesthesia: General

## 2024-07-21 MED ORDER — SODIUM CHLORIDE 0.9 % IV SOLN: INTRAVENOUS | Status: DC

## 2024-07-21 MED ORDER — PROPOFOL 10 MG/ML IV BOLUS
INTRAVENOUS | Status: DC | PRN
  Administered 2024-07-21: 40 mg via INTRAVENOUS
  Administered 2024-07-21: 60 mg via INTRAVENOUS

## 2024-07-21 MED ORDER — PROPOFOL 10 MG/ML IV BOLUS
INTRAVENOUS | Status: AC
  Filled 2024-07-21: qty 40

## 2024-07-21 NOTE — Anesthesia Postprocedure Evaluation (Signed)
 Anesthesia Post Note  Patient: Jose Lee  Procedure(s) Performed: CARDIOVERSION  Patient location during evaluation: Specials Recovery Anesthesia Type: General Level of consciousness: awake and alert Pain management: pain level controlled Vital Signs Assessment: post-procedure vital signs reviewed and stable Respiratory status: spontaneous breathing, nonlabored ventilation, respiratory function stable and patient connected to nasal cannula oxygen Cardiovascular status: blood pressure returned to baseline and stable Postop Assessment: no apparent nausea or vomiting Anesthetic complications: no   No notable events documented.   Last Vitals:  Vitals:   07/21/24 1300 07/21/24 1314  BP: 105/78 103/79  Pulse: 85 87  Resp: 14 (!) 21  Temp:  36.7 C  SpO2: 96% 97%    Last Pain:  Vitals:   07/21/24 1314  TempSrc: Temporal  PainSc:                  Fairy POUR Stefan Karen

## 2024-07-21 NOTE — Anesthesia Preprocedure Evaluation (Signed)
 Anesthesia Evaluation  Patient identified by MRN, date of birth, ID band Patient awake    Reviewed: Allergy & Precautions, NPO status , Patient's Chart, lab work & pertinent test results  Airway Mallampati: III  TM Distance: >3 FB Neck ROM: full    Dental  (+) Chipped   Pulmonary neg pulmonary ROS   Pulmonary exam normal        Cardiovascular + dysrhythmias Atrial Fibrillation  Rhythm:irregular Rate:Tachycardia     Neuro/Psych  Neuromuscular disease  negative psych ROS   GI/Hepatic negative GI ROS, Neg liver ROS,neg GERD  ,,  Endo/Other  negative endocrine ROS    Renal/GU negative Renal ROS  negative genitourinary   Musculoskeletal   Abdominal   Peds  Hematology negative hematology ROS (+)   Anesthesia Other Findings Past Medical History: No date: ADHD No date: Psoriasis  Past Surgical History: No date: right hand surgery     Comment:  2024 age 48: TESTICLE TORSION REDUCTION  BMI    Body Mass Index: 28.80 kg/m      Reproductive/Obstetrics negative OB ROS                              Anesthesia Physical Anesthesia Plan  ASA: 3  Anesthesia Plan: General   Post-op Pain Management:    Induction: Intravenous  PONV Risk Score and Plan: Propofol  infusion and TIVA  Airway Management Planned: Natural Airway and Nasal Cannula  Additional Equipment:   Intra-op Plan:   Post-operative Plan:   Informed Consent: I have reviewed the patients History and Physical, chart, labs and discussed the procedure including the risks, benefits and alternatives for the proposed anesthesia with the patient or authorized representative who has indicated his/her understanding and acceptance.     Dental Advisory Given  Plan Discussed with: Anesthesiologist, CRNA and Surgeon  Anesthesia Plan Comments: (Patient consented for risks of anesthesia including but not limited to:  - adverse  reactions to medications - risk of airway placement if required - damage to eyes, teeth, lips or other oral mucosa - nerve damage due to positioning  - sore throat or hoarseness - Damage to heart, brain, nerves, lungs, other parts of body or loss of life  Patient voiced understanding and assent.)        Anesthesia Quick Evaluation

## 2024-07-21 NOTE — Transfer of Care (Signed)
 Immediate Anesthesia Transfer of Care Note  Patient: Jose Lee  Procedure(s) Performed: CARDIOVERSION  Patient Location: Specials  Anesthesia Type:General  Level of Consciousness: awake, alert , and oriented  Airway & Oxygen Therapy: Patient Spontanous Breathing  Post-op Assessment: Report given to RN and Post -op Vital signs reviewed and stable  Post vital signs: Reviewed and stable  Last Vitals:  Vitals Value Taken Time  BP 104/81 07/21/24 12:42  Temp    Pulse 77 07/21/24 12:44  Resp 18 07/21/24 12:44  SpO2 98 % 07/21/24 12:44  Vitals shown include unfiled device data.  Last Pain:  Vitals:   07/21/24 1210  TempSrc: Temporal  PainSc: 0-No pain         Complications: No notable events documented.

## 2024-07-21 NOTE — CV Procedure (Signed)
Cardioversion procedure note For atrial fibrillation, persistent  Procedure Details:  Consent: Risks of procedure as well as the alternatives and risks of each were explained to the (patient/caregiver).  Consent for procedure obtained.  Time Out: Verified patient identification, verified procedure, site/side was marked, verified correct patient position, special equipment/implants available, medications/allergies/relevent history reviewed, required imaging and test results available.  Performed  Patient placed on cardiac monitor, pulse oximetry, supplemental oxygen as necessary.   Sedation given: propofol IV, Dr. Piscitello Pacer pads placed anterior and posterior chest.   Cardioverted 1 time(s).   Cardioverted at 150 J. Synchronized biphasic Converted to NSR   Evaluation: Findings: Post procedure EKG shows: NSR Complications: None Patient did tolerate procedure well.  Time Spent Directly with the Patient:  45 minutes   Tim Gollan, M.D., Ph.D.  

## 2024-07-22 NOTE — H&P (Addendum)
 H&P Addendum, pre-cardioversion  Patient was seen and evaluated prior to -cardioversion procedure Symptoms, prior testing details again confirmed with the patient Patient examined, no significant change from prior exam Lab work reviewed in detail personally by myself Patient understands risk and benefit of the procedure,  The risks (stroke, cardiac arrhythmias rarely resulting in the need for a temporary or permanent pacemaker, skin irritation or burns and complications associated with conscious sedation including aspiration, arrhythmia, respiratory failure and death), benefits (restoration of normal sinus rhythm) and alternatives of a direct current cardioversion were explained in detail Patient willing to proceed.  Signed, Jose Lunger, MD, Ph.D [facility]    Expand All Collapse All  Electrophysiology Office Note:     Date:  06/21/2024    ID:  Jose Lee, DOB 05-10-1976, MRN [id number]   [facility] Cardiologist:  None  [facility] Electrophysiologist:  Jose ONEIDA HOLTS, MD    Referring MD: Darliss Rogue, MD    Chief Complaint: Atrial fibrillation   History of Present Illness:     Jose Lee is a 48 year old man who I am seeing today for an evaluation of atrial fibrillation at the request of Dr. Darliss.  The patient has a history of ADHD along with his atrial fibrillation.  His atrial fibrillation was diagnosed by his primary care physician this summer.  He was started on aspirin and referred to EP.   He is with his wife today in clinic.  The patient and his wife report subtle symptoms that could be attributed to his atrial fibrillation.  He tells me that he may be more fatigued over the last few months.  He tells me he had a colonoscopy in October 2024.  No mention of atrial fibrillation at that time.  He does not feel palpitations.  No family history of atrial fibrillation.  He rarely uses Adderall.  He rarely drinks.  He does not use any caffeine  supplements.     Objective Their past medical, social and family history was reviewed.     ROS:   Please see the history of present illness.    All other systems reviewed and are negative.   EKGs/Labs/Other Studies Reviewed:     The following studies were reviewed today:   May 11, 2024 echo EF 50 to 55% RV mildly reduced Mild MR   April 27, 2024 ECG shows atrial fibrillation and a ventricular rate of 95 bpm. EKG Interpretation Date/Time:                  Wednesday June 21 2024 15:44:00 EDT Ventricular Rate:         111 PR Interval:                   QRS Duration:             90 QT Interval:                 322 QTC Calculation:437 R Axis:                         -8   Text Interpretation:Atrial fibrillation Confirmed by Lee Jose [phone]) on 06/21/2024 3:51:30 PM      Physical Exam:     VS:  BP 105/70   Pulse (!) 111   Ht 6' 1 (1.854 m)   Wt 219 lb 12.8 oz (99.7 kg)   SpO2 98%   BMI 29.00 kg/m  Wt Readings from Last 3 Encounters:  06/21/24 219 lb 12.8 oz (99.7 kg)  05/30/24 216 lb (98 kg)  05/17/24 224 lb 2 oz (101.7 kg)      GEN: no distress CARD: Irregularly irregular, No MRG RESP: No IWOB. CTAB.         Assessment ASSESSMENT AND PLAN:     1. Persistent atrial fibrillation (HCC)       #Persistent atrial fibrillation Minimally symptomatic.  Normal left ventricular function.  Given relatively recent onset, I would like to give him a trial of sinus rhythm.   He should start Xarelto  once daily at dinner.  When he starts Xarelto , stop aspirin..  In 4 weeks, plan for cardioversion.  He will continue the anticoagulant for 4 weeks after cardioversion. I discussed cardioversion procedure in detail including the risk of stroke.  I stressed the importance of medication adherence leading up to the cardioversion procedure and after to minimize stroke risk.   Follow-up with APP in 10 weeks to review heart rhythm and treatment options for his atrial  fibrillation.  He would be a good candidate for catheter ablation if he has recurrence of arrhythmia.         Signed, Jose DASEN. Cindie, MD, [facility], [facility] 06/21/2024 4:16 PM    Electrophysiology [facility]      Patient Instructions by Chauvigne, Carlyle, RN at 06/21/2024 3:40 PM  Author: Chauvigne, Carlyle, RN Author Type: Registered Nurse Filed: 06/21/2024  4:11 PM  Note Status: Addendum Hassel: Cosign Not Required Encounter Date: 06/21/2024  Editor: Chauvigne, Carlyle, RN (Registered Nurse)      Prior Versions: 1. Chauvigne, Carlyle, RN (Registered Nurse) at 06/21/2024  4:06 PM - Signed  Medication Instructions:  Your physician has recommended you make the following change in your medication:  1) START taking Xarelto  20 mg once daily  2) STOP taking aspirin   *If you need a refill on your cardiac medications before your next appointment, please call your pharmacy*   Lab Work: BMET and CBC    Testing/Procedures: Cardioversion  Your physician has recommended that you have a Cardioversion (DCCV). Electrical Cardioversion uses a jolt of electricity to your heart either through paddles or wired patches attached to your chest. This is a controlled, usually prescheduled, procedure. Defibrillation is done under light anesthesia in the hospital, and you usually go home the day of the procedure. This is done to get your heart back into a normal rhythm. You are not awake for the procedure. Please see the instruction sheet given to you today.   Follow-Up: At [facility], you and your health needs are our priority.  As part of our continuing mission to provide you with exceptional heart care, our providers are all part of one team.  This team includes your primary Cardiologist (physician) and Advanced Practice Providers or APPs (Physician Assistants and Nurse Practitioners) who all work together to provide you with the care you need, when you need it.   Your next  appointment:   8 weeks   Provider:   Suzann Riddle, NP

## 2024-07-24 ENCOUNTER — Encounter: Payer: Self-pay | Admitting: Cardiovascular Disease

## 2024-08-15 NOTE — Progress Notes (Unsigned)
 Electrophysiology Clinic Note    Date:  08/16/2024  Patient ID:  Jose Lee, DOB 10/08/1976, MRN [id number] PCP:  Jimmy Charlie FERNS, MD  Cardiologist:  None  Electrophysiologist:  OLE ONEIDA HOLTS, MD  Electrophysiology APP:  Kohner Orlick, NP     Discussed the use of AI scribe software for clinical note transcription with the patient, who gave verbal consent to proceed.   Patient Profile    Chief Complaint: AF follow-up  History of Present Illness: Jose Lee is a 48 y.o. male with PMH notable for persis AFib, ADHD; seen today for OLE ONEIDA HOLTS, MD for routine electrophysiology followup.   He last saw Dr. HOLTS 06/2024 for initial EP consult of AFib. He was in afib during appt, relatively asymptomatic. He was transitioned from ASA > xarelto , and planned DCCV in 4 weeks. S/p DCCV 9/19 with conversion to sinus rhythm.   On follow-up today, he has not experienced any episodes of atrial fibrillation post-cardioversion. His smartwatch monitoring shows no irregularities in heart rhythm. Prior to the procedure, he experienced fatigue and cold sweats, which have resolved since the cardioversion. He reports improved energy levels and no recurrence of cold sweats. There are no palpitations, chest pain, chest pressure, dizziness, or lightheadedness. He takes Xarelto  once daily with assistance from his wife to remember the medication. He does not smoke, consume excessive alcohol, or use illicit drugs. His wife reports some snoring but no significant symptoms of sleep apnea.  His wife joins for appt today.     Arrhythmia/Device History No specialty comments available.    ROS:  Please see the history of present illness. All other systems are reviewed and otherwise negative.    Physical Exam    VS:  BP 106/62 (BP Location: Left Arm, Patient Position: Sitting, Cuff Size: Normal)   Pulse (!) 57   Ht 6' 1 (1.854 m)   Wt 222 lb (100.7 kg)   SpO2 97%   BMI 29.29  kg/m  BMI: Body mass index is 29.29 kg/m.    STOP-Bang Score:  2         Wt Readings from Last 3 Encounters:  08/16/24 222 lb (100.7 kg)  07/21/24 218 lb 4.8 oz (99 kg)  06/21/24 219 lb 12.8 oz (99.7 kg)     GEN- The patient is well appearing, alert and oriented x 3 today.   Lungs- Clear to ausculation bilaterally, normal work of breathing.  Heart- Regular rate and rhythm, no murmurs, rubs or gallops Extremities- No peripheral edema, warm, dry    Studies Reviewed   Previous EP, cardiology notes.    EKG is ordered. Personal review of EKG from today shows:    EKG Interpretation Date/Time:  Wednesday August 16 2024 11:15:01 EDT Ventricular Rate:  57 PR Interval:  150 QRS Duration:  94 QT Interval:  392 QTC Calculation: 381 R Axis:   -23  Text Interpretation: Sinus bradycardia Confirmed by Calieb Lichtman [phone]) on 08/16/2024 11:18:12 AM    TTE, 05/11/2024  1. Left ventricular ejection fraction, by estimation, is 50 to 55%. Left ventricular ejection fraction by PLAX is 49 %. The left ventricle has low normal function. The left ventricle has no regional wall motion abnormalities. Left ventricular diastolic parameters are indeterminate.   2. Right ventricular systolic function is mildly reduced. The right ventricular size is normal.   3. The mitral valve is normal in structure. Mild mitral valve regurgitation. No evidence of mitral stenosis.   4. The  aortic valve has an indeterminant number of cusps. Aortic valve regurgitation is not visualized. No aortic stenosis is present.   5. The inferior vena cava is normal in size with greater than 50% respiratory variability, suggesting right atrial pressure of 3 mmHg.    Assessment and Plan     #) persis Afib S/p DCCV on 9/19 with improvement in energy and no further night sweats He is maintaining sinus rhythm Using watch to monitor for arrhythmia We briefly discussed future AFib treatments including medications vs AF ablation.  At this time, he would like to monitor for recurrence prior to pursuing additional treatment modalities, which I think is very reasonable He appears to be good AF ablation candidate, should we proceed, though will defer to MD for final decision  #) Hypercoag d/t persis afib CHA2DS2-VASc Score = at least 0 [CHF History: 0, HTN History: 0, Diabetes History: 0, Stroke History: 0, Vascular Disease History: 0, Age Score: 0, Gender Score: 0].  Therefore, the patient's annual risk of stroke is 0.2 %.    He will continue 20mg  xarelto  until 1 month post-DCCV.  OK to stop xarelto  on 10/20 No bleeding concerns   #) snoring StopBang score = 2 Consider pulm referral for OSA evaluation. His wife will continue to monitor for apneic events     Current medicines are reviewed at length with the patient today.   The patient does not have concerns regarding his medicines.  The following changes were made today:   STOP xarelto  on 10/20  Labs/ tests ordered today include:  Orders Placed This Encounter  Procedures   EKG 12-Lead     Disposition: Follow up with EP APP  in 6 months, sooner if needed   Signed, Chantal Needle, NP  08/16/24  1:03 PM  Electrophysiology [facility]

## 2024-08-16 ENCOUNTER — Ambulatory Visit: Attending: Cardiology | Admitting: Cardiology

## 2024-08-16 ENCOUNTER — Encounter: Payer: Self-pay | Admitting: Cardiology

## 2024-08-16 VITALS — BP 106/62 | HR 57 | Ht 73.0 in | Wt 222.0 lb

## 2024-08-16 DIAGNOSIS — I4819 Other persistent atrial fibrillation: Secondary | ICD-10-CM | POA: Diagnosis not present

## 2024-08-16 DIAGNOSIS — R0683 Snoring: Secondary | ICD-10-CM | POA: Diagnosis not present

## 2024-08-16 DIAGNOSIS — I480 Paroxysmal atrial fibrillation: Secondary | ICD-10-CM

## 2024-08-16 DIAGNOSIS — D6869 Other thrombophilia: Secondary | ICD-10-CM | POA: Diagnosis not present

## 2024-08-16 NOTE — Patient Instructions (Signed)
 Medication Instructions:   Stop Xarelto  20 MG daily on Monday, August 21, 2024  *If you need a refill on your cardiac medications before your next appointment, please call your pharmacy*  Lab Work: No labs ordered today  If you have labs (blood work) drawn today and your tests are completely normal, you will receive your results only by: MyChart Message (if you have MyChart) OR A paper copy in the mail If you have any lab test that is abnormal or we need to change your treatment, we will call you to review the results.  Testing/Procedures: No test ordered today   Follow-Up: At [facility], you and your health needs are our priority.  As part of our continuing mission to provide you with exceptional heart care, our providers are all part of one team.  This team includes your primary Cardiologist (physician) and Advanced Practice Providers or APPs (Physician Assistants and Nurse Practitioners) who all work together to provide you with the care you need, when you need it.  Your next appointment:   6 month(s)  Provider:   Suzann Riddle, NP    We recommend signing up for the patient portal called MyChart.  Sign up information is provided on this After Visit Summary.  MyChart is used to connect with patients for Virtual Visits (Telemedicine).  Patients are able to view lab/test results, encounter notes, upcoming appointments, etc.  Non-urgent messages can be sent to your provider as well.   To learn more about what you can do with MyChart, go to [email or web address].

## 2025-04-04 ENCOUNTER — Encounter
